# Patient Record
Sex: Female | Born: 1965 | Race: Black or African American | Hispanic: No | Marital: Married | Smoking: Never smoker
Health system: Southern US, Community
[De-identification: ages and names within clinical notes are randomized; demographics above are authoritative.]

## PROBLEM LIST (undated history)

## (undated) DIAGNOSIS — I10 Essential (primary) hypertension: Secondary | ICD-10-CM

## (undated) DIAGNOSIS — D509 Iron deficiency anemia, unspecified: Secondary | ICD-10-CM

## (undated) DIAGNOSIS — E785 Hyperlipidemia, unspecified: Secondary | ICD-10-CM

## (undated) HISTORY — DX: Hyperlipidemia, unspecified: E78.5

## (undated) HISTORY — DX: Essential (primary) hypertension: I10

## (undated) HISTORY — DX: Iron deficiency anemia, unspecified: D50.9

---

## 1997-06-20 ENCOUNTER — Inpatient Hospital Stay (HOSPITAL_COMMUNITY): Admission: AD | Admit: 1997-06-20 | Discharge: 1997-06-23 | Payer: Self-pay | Admitting: Obstetrics and Gynecology

## 1998-04-03 ENCOUNTER — Other Ambulatory Visit: Admission: RE | Admit: 1998-04-03 | Discharge: 1998-04-03 | Payer: Self-pay | Admitting: *Deleted

## 2000-03-11 ENCOUNTER — Other Ambulatory Visit: Admission: RE | Admit: 2000-03-11 | Discharge: 2000-03-11 | Payer: Self-pay | Admitting: Obstetrics and Gynecology

## 2001-11-23 ENCOUNTER — Other Ambulatory Visit: Admission: RE | Admit: 2001-11-23 | Discharge: 2001-11-23 | Payer: Self-pay | Admitting: Obstetrics and Gynecology

## 2002-01-04 ENCOUNTER — Ambulatory Visit (HOSPITAL_COMMUNITY): Admission: RE | Admit: 2002-01-04 | Discharge: 2002-01-04 | Payer: Self-pay | Admitting: Obstetrics and Gynecology

## 2002-01-04 ENCOUNTER — Encounter: Payer: Self-pay | Admitting: Obstetrics and Gynecology

## 2003-06-07 ENCOUNTER — Ambulatory Visit (HOSPITAL_COMMUNITY): Admission: RE | Admit: 2003-06-07 | Discharge: 2003-06-07 | Payer: Self-pay | Admitting: Family Medicine

## 2003-07-04 ENCOUNTER — Other Ambulatory Visit: Admission: RE | Admit: 2003-07-04 | Discharge: 2003-07-04 | Payer: Self-pay | Admitting: Family Medicine

## 2004-01-30 ENCOUNTER — Ambulatory Visit (HOSPITAL_COMMUNITY): Admission: RE | Admit: 2004-01-30 | Discharge: 2004-01-30 | Payer: Self-pay | Admitting: Obstetrics and Gynecology

## 2004-07-09 ENCOUNTER — Inpatient Hospital Stay (HOSPITAL_COMMUNITY): Admission: AD | Admit: 2004-07-09 | Discharge: 2004-07-12 | Payer: Self-pay | Admitting: Obstetrics and Gynecology

## 2004-08-20 ENCOUNTER — Other Ambulatory Visit: Admission: RE | Admit: 2004-08-20 | Discharge: 2004-08-20 | Payer: Self-pay | Admitting: Obstetrics and Gynecology

## 2005-07-16 ENCOUNTER — Encounter: Admission: RE | Admit: 2005-07-16 | Discharge: 2005-07-16 | Payer: Self-pay | Admitting: Obstetrics and Gynecology

## 2006-03-04 ENCOUNTER — Other Ambulatory Visit: Admission: RE | Admit: 2006-03-04 | Discharge: 2006-03-04 | Payer: Self-pay | Admitting: Obstetrics and Gynecology

## 2006-09-30 ENCOUNTER — Encounter: Admission: RE | Admit: 2006-09-30 | Discharge: 2006-09-30 | Payer: Self-pay | Admitting: Obstetrics and Gynecology

## 2007-02-09 ENCOUNTER — Ambulatory Visit: Payer: Self-pay | Admitting: Family Medicine

## 2007-02-09 DIAGNOSIS — D509 Iron deficiency anemia, unspecified: Secondary | ICD-10-CM

## 2007-02-10 LAB — CONVERTED CEMR LAB
Basophils Absolute: 0.1 10*3/uL (ref 0.0–0.1)
Basophils Relative: 0.8 % (ref 0.0–1.0)
Cholesterol: 187 mg/dL (ref 0–200)
Eosinophils Absolute: 0.1 10*3/uL (ref 0.0–0.6)
Eosinophils Relative: 0.7 % (ref 0.0–5.0)
Glucose, Bld: 86 mg/dL (ref 70–99)
HCT: 36.9 % (ref 36.0–46.0)
HDL: 48.2 mg/dL (ref >39.0)
Hemoglobin: 12.8 g/dL (ref 12.0–15.0)
LDL Cholesterol: 116 mg/dL — ABNORMAL HIGH (ref 0–99)
Lymphocytes Relative: 32.9 % (ref 12.0–46.0)
MCHC: 34.6 g/dL (ref 30.0–36.0)
MCV: 99.2 fL (ref 78.0–100.0)
Monocytes Absolute: 0.7 10*3/uL (ref 0.2–0.7)
Monocytes Relative: 7.5 % (ref 3.0–11.0)
Neutro Abs: 5.2 10*3/uL (ref 1.4–7.7)
Neutrophils Relative %: 58.1 % (ref 43.0–77.0)
Platelets: 223 10*3/uL (ref 150–400)
RBC: 3.72 M/uL — ABNORMAL LOW (ref 3.87–5.11)
RDW: 11.2 % — ABNORMAL LOW (ref 11.5–14.6)
TSH: 1.07 microintl units/mL (ref 0.35–5.50)
Total CHOL/HDL Ratio: 3.9
Triglycerides: 112 mg/dL (ref 0–149)
VLDL: 22 mg/dL (ref 0–40)
WBC: 9.1 10*3/uL (ref 4.5–10.5)

## 2007-02-11 ENCOUNTER — Telehealth (INDEPENDENT_AMBULATORY_CARE_PROVIDER_SITE_OTHER): Payer: Self-pay | Admitting: *Deleted

## 2007-11-23 ENCOUNTER — Encounter: Admission: RE | Admit: 2007-11-23 | Discharge: 2007-11-23 | Payer: Self-pay | Admitting: Obstetrics and Gynecology

## 2008-07-19 ENCOUNTER — Ambulatory Visit: Payer: Self-pay | Admitting: Family Medicine

## 2008-07-19 DIAGNOSIS — I10 Essential (primary) hypertension: Secondary | ICD-10-CM

## 2008-07-20 ENCOUNTER — Encounter (INDEPENDENT_AMBULATORY_CARE_PROVIDER_SITE_OTHER): Payer: Self-pay | Admitting: *Deleted

## 2008-07-20 LAB — CONVERTED CEMR LAB
BUN: 10 mg/dL (ref 6–23)
CO2: 29 meq/L (ref 19–32)
Calcium: 9.2 mg/dL (ref 8.4–10.5)
Chloride: 108 meq/L (ref 96–112)
Creatinine, Ser: 0.9 mg/dL (ref 0.4–1.2)
GFR calc non Af Amer: 87.12 mL/min (ref >60)
Glucose, Bld: 109 mg/dL — ABNORMAL HIGH (ref 70–99)
Potassium: 4.8 meq/L (ref 3.5–5.1)
Sodium: 144 meq/L (ref 135–145)

## 2008-08-23 ENCOUNTER — Ambulatory Visit: Payer: Self-pay | Admitting: Family Medicine

## 2008-08-29 ENCOUNTER — Encounter (INDEPENDENT_AMBULATORY_CARE_PROVIDER_SITE_OTHER): Payer: Self-pay | Admitting: *Deleted

## 2008-08-29 LAB — CONVERTED CEMR LAB
ALT: 21 units/L (ref 0–35)
AST: 23 units/L (ref 0–37)
Albumin: 3.8 g/dL (ref 3.5–5.2)
Alkaline Phosphatase: 69 units/L (ref 39–117)
BUN: 11 mg/dL (ref 6–23)
Basophils Absolute: 0 10*3/uL (ref 0.0–0.1)
Basophils Relative: 0.4 % (ref 0.0–3.0)
Bilirubin, Direct: 0.1 mg/dL (ref 0.0–0.3)
CO2: 31 meq/L (ref 19–32)
Calcium: 8.9 mg/dL (ref 8.4–10.5)
Chloride: 106 meq/L (ref 96–112)
Cholesterol: 223 mg/dL — ABNORMAL HIGH (ref 0–200)
Creatinine, Ser: 0.6 mg/dL (ref 0.4–1.2)
Direct LDL: 139.1 mg/dL
Eosinophils Absolute: 0.1 10*3/uL (ref 0.0–0.7)
Eosinophils Relative: 1.5 % (ref 0.0–5.0)
Folate: 7.3 ng/mL
GFR calc non Af Amer: 139.04 mL/min (ref >60)
Glucose, Bld: 93 mg/dL (ref 70–99)
HCT: 35.8 % — ABNORMAL LOW (ref 36.0–46.0)
HDL: 60.4 mg/dL (ref >39.00)
Hemoglobin: 12.5 g/dL (ref 12.0–15.0)
Lymphocytes Relative: 44 % (ref 12.0–46.0)
Lymphs Abs: 2.2 10*3/uL (ref 0.7–4.0)
MCHC: 34.8 g/dL (ref 30.0–36.0)
MCV: 100.8 fL — ABNORMAL HIGH (ref 78.0–100.0)
Monocytes Absolute: 0.5 10*3/uL (ref 0.1–1.0)
Monocytes Relative: 9.5 % (ref 3.0–12.0)
Neutro Abs: 2.3 10*3/uL (ref 1.4–7.7)
Neutrophils Relative %: 44.6 % (ref 43.0–77.0)
Platelets: 248 10*3/uL (ref 150.0–400.0)
Potassium: 4.2 meq/L (ref 3.5–5.1)
RBC: 3.55 M/uL — ABNORMAL LOW (ref 3.87–5.11)
RDW: 11.3 % — ABNORMAL LOW (ref 11.5–14.6)
Sodium: 140 meq/L (ref 135–145)
TSH: 1.07 microintl units/mL (ref 0.35–5.50)
Total Bilirubin: 0.9 mg/dL (ref 0.3–1.2)
Total CHOL/HDL Ratio: 4
Total Protein: 7.6 g/dL (ref 6.0–8.3)
Triglycerides: 63 mg/dL (ref 0.0–149.0)
VLDL: 12.6 mg/dL (ref 0.0–40.0)
Vitamin B-12: 473 pg/mL (ref 211–911)
WBC: 5.1 10*3/uL (ref 4.5–10.5)

## 2008-12-06 ENCOUNTER — Telehealth (INDEPENDENT_AMBULATORY_CARE_PROVIDER_SITE_OTHER): Payer: Self-pay | Admitting: *Deleted

## 2008-12-30 ENCOUNTER — Telehealth (INDEPENDENT_AMBULATORY_CARE_PROVIDER_SITE_OTHER): Payer: Self-pay | Admitting: *Deleted

## 2009-02-21 ENCOUNTER — Encounter: Admission: RE | Admit: 2009-02-21 | Discharge: 2009-02-21 | Payer: Self-pay | Admitting: Obstetrics and Gynecology

## 2009-05-30 LAB — CONVERTED CEMR LAB: Pap Smear: NORMAL

## 2009-08-14 ENCOUNTER — Telehealth (INDEPENDENT_AMBULATORY_CARE_PROVIDER_SITE_OTHER): Payer: Self-pay | Admitting: *Deleted

## 2009-08-28 ENCOUNTER — Ambulatory Visit: Payer: Self-pay | Admitting: Family Medicine

## 2009-08-28 LAB — CONVERTED CEMR LAB
ALT: 21 units/L (ref 0–35)
AST: 20 units/L (ref 0–37)
Albumin: 3.8 g/dL (ref 3.5–5.2)
Alkaline Phosphatase: 66 units/L (ref 39–117)
BUN: 11 mg/dL (ref 6–23)
Basophils Absolute: 0 10*3/uL (ref 0.0–0.1)
Basophils Relative: 0.4 % (ref 0.0–3.0)
Bilirubin, Direct: 0 mg/dL (ref 0.0–0.3)
CO2: 30 meq/L (ref 19–32)
Calcium: 8.7 mg/dL (ref 8.4–10.5)
Chloride: 104 meq/L (ref 96–112)
Cholesterol: 186 mg/dL (ref 0–200)
Creatinine, Ser: 0.8 mg/dL (ref 0.4–1.2)
Eosinophils Absolute: 0.1 10*3/uL (ref 0.0–0.7)
Eosinophils Relative: 1.5 % (ref 0.0–5.0)
GFR calc non Af Amer: 99.31 mL/min (ref >60)
Glucose, Bld: 97 mg/dL (ref 70–99)
HCT: 34.4 % — ABNORMAL LOW (ref 36.0–46.0)
HDL: 53.2 mg/dL (ref >39.00)
Hemoglobin: 11.9 g/dL — ABNORMAL LOW (ref 12.0–15.0)
LDL Cholesterol: 124 mg/dL — ABNORMAL HIGH (ref 0–99)
Lymphocytes Relative: 43.2 % (ref 12.0–46.0)
Lymphs Abs: 2.3 10*3/uL (ref 0.7–4.0)
MCHC: 34.5 g/dL (ref 30.0–36.0)
MCV: 100.4 fL — ABNORMAL HIGH (ref 78.0–100.0)
Monocytes Absolute: 0.4 10*3/uL (ref 0.1–1.0)
Monocytes Relative: 8.3 % (ref 3.0–12.0)
Neutro Abs: 2.4 10*3/uL (ref 1.4–7.7)
Neutrophils Relative %: 46.6 % (ref 43.0–77.0)
Platelets: 304 10*3/uL (ref 150.0–400.0)
Potassium: 3.4 meq/L — ABNORMAL LOW (ref 3.5–5.1)
RBC: 3.43 M/uL — ABNORMAL LOW (ref 3.87–5.11)
RDW: 12.8 % (ref 11.5–14.6)
Sodium: 140 meq/L (ref 135–145)
TSH: 0.86 microintl units/mL (ref 0.35–5.50)
Total Bilirubin: 0.6 mg/dL (ref 0.3–1.2)
Total CHOL/HDL Ratio: 3
Total Protein: 7.9 g/dL (ref 6.0–8.3)
Triglycerides: 43 mg/dL (ref 0.0–149.0)
VLDL: 8.6 mg/dL (ref 0.0–40.0)
WBC: 5.3 10*3/uL (ref 4.5–10.5)

## 2009-08-29 ENCOUNTER — Telehealth (INDEPENDENT_AMBULATORY_CARE_PROVIDER_SITE_OTHER): Payer: Self-pay | Admitting: *Deleted

## 2009-08-29 ENCOUNTER — Encounter: Payer: Self-pay | Admitting: Family Medicine

## 2009-08-29 LAB — CONVERTED CEMR LAB
Folate: 5.5 ng/mL
Vit D, 25-Hydroxy: 20 ng/mL — ABNORMAL LOW (ref 30–89)
Vitamin B-12: 662 pg/mL (ref 211–911)

## 2010-02-26 ENCOUNTER — Ambulatory Visit: Payer: Self-pay | Admitting: Family Medicine

## 2010-02-26 DIAGNOSIS — N390 Urinary tract infection, site not specified: Secondary | ICD-10-CM | POA: Insufficient documentation

## 2010-02-26 LAB — CONVERTED CEMR LAB
Bilirubin Urine: NEGATIVE
Glucose, Urine, Semiquant: NEGATIVE
Ketones, urine, test strip: NEGATIVE
Nitrite: NEGATIVE
Protein, U semiquant: NEGATIVE
Specific Gravity, Urine: 1.015
Urobilinogen, UA: 0.2
WBC Urine, dipstick: NEGATIVE
pH: 6

## 2010-05-28 ENCOUNTER — Other Ambulatory Visit: Payer: Self-pay | Admitting: Obstetrics and Gynecology

## 2010-05-28 DIAGNOSIS — Z1239 Encounter for other screening for malignant neoplasm of breast: Secondary | ICD-10-CM

## 2010-05-31 NOTE — Assessment & Plan Note (Signed)
Summary: UTI?//PH   Vital Signs:  Patient profile:   45 year old female Weight:      176 pounds Temp:     Jeanette.3 degrees F oral BP sitting:   122 / 70  (left arm)  Vitals Entered By: Doristine Devoid CMA (February 26, 2010 11:39 AM) CC: uti sx    History of Present Illness: Jeanette Kelley here today for ? UTI.  reports 'swelling down there', 'like when it's supposed to come on but it didn't come on b/c i have the mirena'.  holds urine, doesn't drink a lot of water.  has dysuria.  no hematuria.  + frequency, hesitancy, urgency.  Allergies (verified): No Known Drug Allergies  Review of Systems      See HPI  Physical Exam  General:  well-nourished, well-hydrated, and overweight-appearing.   Abdomen:  Bowel sounds positive,abdomen soft and non-tender without masses, organomegaly or hernias noted.  no suprapubic or CVA tenderness Genitalia:  normal introitus, no external lesions, no vaginal discharge, and mucosa pink and moist.  IUD strings visible     Impression & Recommendations:  Problem # 1:  UTI (ICD-599.0) Assessment New given pt's abnormal UA will tx as UTI.  start keflex.  pt reports swollen vagina but no abnormality on PE.  encouraged her to see GYN if sxs persist.  Pt expresses understanding and is in agreement w/ this plan. Her updated medication list for this problem includes:    Cephalexin 500 Mg Tabs (Cephalexin) .Marland Kitchen... Take one by mouth 2 times daily  Orders: Specimen Handling (16109) T-Culture, Urine ([id number])  Complete Medication List: 1)  Hydrochlorothiazide 12.5 Mg Tabs (Hydrochlorothiazide) .... Take 1 tab  by mouth every morning 2)  Cephalexin 500 Mg Tabs (Cephalexin) .... Take one by mouth 2 times daily  Patient Instructions: 1)  You have a bladder infection and this is likely the cause of your pelvic symptoms 2)  Take the Cephalexin as directed for your bladder infection 3)  Your IUD strings are visible and appear to be in the right place 4)  If you  continue to have problems you should contact your GYN 5)  Hang in there! Prescriptions: CEPHALEXIN 500 MG  TABS (CEPHALEXIN) take one by mouth 2 times daily  #10 x 0   Entered and Authorized by:   Neena Rhymes MD   Signed by:   Neena Rhymes MD on 02/26/2010   Method used:   Electronically to        [facility] [address] #[id number]* (retail)       [address] [place], [place]  [address]       Ph: [phone]       Fax: [phone]   RxID:   [id number]    Orders Added: 1)  Specimen Handling [99000] 2)  T-Culture, Urine [[id number]] 3)  Est. Patient Level III [87564]    Laboratory Results   Urine Tests    Routine Urinalysis   Glucose: negative   (Normal Range: Negative) Bilirubin: negative   (Normal Range: Negative) Ketone: negative   (Normal Range: Negative) Spec. Gravity: 1.015   (Normal Range: 1.003-1.035) Blood: small   (Normal Range: Negative) pH: 6.0   (Normal Range: 5.0-8.0) Protein: negative   (Normal Range: Negative) Urobilinogen: 0.2   (Normal Range: 0-1) Nitrite: negative   (Normal Range: Negative) Leukocyte Esterace: negative   (Normal Range: Negative)

## 2010-05-31 NOTE — Progress Notes (Signed)
Summary: labs  Phone Note Outgoing Call   Call placed by: Doristine Devoid,  Aug 29, 2009 1:33 PM Call placed to: Patient Summary of Call: pt's level is low.  needs 50,000 units weekly x 8 weeks and then recheck level   Follow-up for Phone Call        called patient unable to reach or leave msg will try later.......Marland KitchenDoristine Devoid  Aug 29, 2009 1:36 PM  no answer, unable to leave message.........Marland KitchenShary Decamp  Sep 02, 2009 10:08 AM  unable to reach patient or leave msg will mail letter to have patient contact office .......Marland KitchenDoristine Devoid  Sep 07, 2009 4:25 PM

## 2010-05-31 NOTE — Assessment & Plan Note (Signed)
Summary: cpx//pt will be fasting//lch   Vital Signs:  Patient profile:   45 year old female Height:      60.75 inches Weight:      169 pounds BMI:     32.31 Pulse rate:   70 / minute BP sitting:   122 / 80  (left arm)  Vitals Entered By: Doristine Devoid (Aug 28, 2009 8:43 AM) CC: CPX AND LABS   History of Present Illness: 45 yo woman here today for CPE.  pap done 4/11 by GYN- [facility]er.  now has Mirena.  no concerns about her health.  UTD on Mammogram.  Preventive Screening-Counseling & Management  Alcohol-Tobacco     Alcohol drinks/day: 0     Smoking Status: never  Caffeine-Diet-Exercise     Does Patient Exercise: no      Sexual History:  currently monogamous.        Drug Use:  never.    Problems Prior to Update: 1)  Neoplasm, Malignant, Colon, Family Hx  (ICD-V16.0) 2)  Hypertension, Benign Essential  (ICD-401.1) 3)  Preventive Health Care  (ICD-V70.0) 4)  Anemia-iron Deficiency  (ICD-280.9) 5)  Family History Breast Cancer 1st Degree Relative <50  (ICD-V16.3) 6)  Family History Diabetes 1st Degree Relative  (ICD-V18.0)  Current Medications (verified): 1)  Hydrochlorothiazide 12.5 Mg  Tabs (Hydrochlorothiazide) .... Take 1 Tab  By Mouth Every Morning  Allergies (verified): No Known Drug Allergies  Past History:  Past Surgical History: Last updated: 02/09/2007 c/s  Family History: Last updated: 08/23/2008 Family History Diabetes 1st degree relative Family History Hypertension Family History Breast cancer 1st degree relative <50 maternal uncle had colon cancer in late 50s.  Social History: Last updated: 07/19/2008 Occupation: Hair stylist Married, 2 sons- 1999, 2006 Never Smoked Alcohol use-no Drug use-no Regular exercise-yes  Past Medical History: Anemia-iron deficiency Mirena- 4/11  Review of Systems  The patient denies anorexia, fever, weight loss, weight gain, vision loss, decreased hearing, hoarseness, chest pain, syncope, dyspnea on  exertion, peripheral edema, prolonged cough, headaches, abdominal pain, melena, hematochezia, severe indigestion/heartburn, hematuria, suspicious skin lesions, depression, abnormal bleeding, enlarged lymph nodes, and breast masses.    Physical Exam  General:  well-nourished, well-hydrated, and overweight-appearing.   Head:  Normocephalic and atraumatic without obvious abnormalities. No apparent alopecia or balding. Eyes:  No corneal or conjunctival inflammation noted. EOMI. Perrla. Funduscopic exam benign, without hemorrhages, exudates or papilledema. Vision grossly normal. Ears:  External ear exam shows no significant lesions or deformities.  Otoscopic examination reveals clear canals, tympanic membranes are intact bilaterally without bulging, retraction, inflammation or discharge. Hearing is grossly normal bilaterally. Nose:  External nasal examination shows no deformity or inflammation. Nasal mucosa are pink and moist without lesions or exudates. Mouth:  Oral mucosa and oropharynx without lesions or exudates.  Teeth in good repair. Neck:  No deformities, masses, or tenderness noted. Breasts:  deferred to GYN Lungs:  Normal respiratory effort, chest expands symmetrically. Lungs are clear to auscultation, no crackles or wheezes. Heart:  Normal rate and regular rhythm. S1 and S2 normal without gallop, murmur, click, rub or other extra sounds. Abdomen:  Bowel sounds positive,abdomen soft and non-tender without masses, organomegaly or hernias noted. Genitalia:  deferred to gyn Msk:  No deformity or scoliosis noted of thoracic or lumbar spine.   Pulses:  +2 carotid, radial, DP Extremities:  No clubbing, cyanosis, edema, or deformity noted with normal full range of motion of all joints.   Neurologic:  No cranial nerve deficits noted. Station  and gait are normal. Plantar reflexes are down-going bilaterally. DTRs are symmetrical throughout. Sensory, motor and coordinative functions appear intact. Skin:   Intact without suspicious lesions or rashes Cervical Nodes:  No lymphadenopathy noted Psych:  Cognition and judgment appear intact. Alert and cooperative with normal attention span and concentration. No apparent delusions, illusions, hallucinations   Impression & Recommendations:  Problem # 1:  PREVENTIVE HEALTH CARE (ICD-V70.0) Assessment Unchanged Pt's PE WNL.  UTD on pap, mammogram.  no-showed for colonoscopy.  stressed importance of colonoscopy given family hx.  pt says 'i'll wait another year or so'.  check labs.  anticipatory guidance provided. Orders: Venipuncture (14782) TLB-Lipid Panel (80061-LIPID) TLB-BMP (Basic Metabolic Panel-BMET) (80048-METABOL) TLB-CBC Platelet - w/Differential (85025-CBCD) TLB-Hepatic/Liver Function Pnl (80076-HEPATIC) TLB-TSH (Thyroid Stimulating Hormone) (84443-TSH) T-Vitamin D (25-Hydroxy) (95621-30865)  Problem # 2:  HYPERTENSION, BENIGN ESSENTIAL (ICD-401.1) Assessment: Unchanged well controlled today.  asymptomatic. Her updated medication list for this problem includes:    Hydrochlorothiazide 12.5 Mg Tabs (Hydrochlorothiazide) .Marland Kitchen... Take 1 tab  by mouth every morning  Complete Medication List: 1)  Hydrochlorothiazide 12.5 Mg Tabs (Hydrochlorothiazide) .... Take 1 tab  by mouth every morning  Patient Instructions: 1)  Please schedule a follow-up appointment in 6 months to recheck blood pressure. 2)  Try and make healthy diet choices and get regular exercise 3)  We'll notify you of your lab results 4)  Call with any questions or concerns 5)  Please think about getting your colonoscopy 6)  Have a great summer!  Prescriptions: HYDROCHLOROTHIAZIDE 12.5 MG  TABS (HYDROCHLOROTHIAZIDE) Take 1 tab  by mouth every morning  #30 x 6   Entered by:   Doristine Devoid   Authorized by:   Neena Rhymes MD   Signed by:   Doristine Devoid on 08/28/2009   Method used:   Electronically to        [facility] [address] #[id number]* (retail)       [address]  [place], [place]  [address]       Ph: [phone]       Fax: [phone]   RxID:   [id number]    Preventive Care Screening  Pap Smear:    Date:  05/30/2009    Results:  normal

## 2010-05-31 NOTE — Progress Notes (Signed)
Summary: refill  Phone Note Refill Request Message from:  Fax from Pharmacy on August 14, 2009 8:51 AM  Refills Requested: Medication #1:  HYDROCHLOROTHIAZIDE 12.5 MG  TABS Take 1 tab  by mouth every morning. [facility] [address] fax [phone]   Method Requested: Fax to Local Pharmacy Next Appointment Scheduled: 08/28/2009 Initial call taken by: Barb Merino,  August 14, 2009 8:52 AM    Prescriptions: HYDROCHLOROTHIAZIDE 12.5 MG  TABS (HYDROCHLOROTHIAZIDE) Take 1 tab  by mouth every morning  #30 x 0   Entered by:   Kandice Hams   Authorized by:   Neena Rhymes MD   Signed by:   Kandice Hams on 08/14/2009   Method used:   Faxed to ...       [facility] #[id number]* (retail)       [address] [place], [place]  [address]       Ph: [phone]       Fax: [phone]   RxID:   [id number]

## 2010-06-05 ENCOUNTER — Ambulatory Visit
Admission: RE | Admit: 2010-06-05 | Discharge: 2010-06-05 | Disposition: A | Discharge status: Home / Self Care | Payer: Self-pay | Source: Ambulatory Visit | Attending: Obstetrics and Gynecology | Admitting: Obstetrics and Gynecology

## 2010-06-05 DIAGNOSIS — Z1239 Encounter for other screening for malignant neoplasm of breast: Secondary | ICD-10-CM

## 2010-08-17 ENCOUNTER — Other Ambulatory Visit: Payer: Self-pay | Admitting: *Deleted

## 2010-08-17 MED ORDER — HYDROCHLOROTHIAZIDE 12.5 MG PO CAPS: 12.5000 mg | ORAL_CAPSULE | Freq: Every day | ORAL | Status: DC

## 2010-08-17 NOTE — Telephone Encounter (Signed)
Due for cpx next month, sent refill.

## 2010-09-03 ENCOUNTER — Encounter: Payer: Self-pay | Admitting: Family Medicine

## 2010-09-14 NOTE — Op Note (Signed)
NAMESAMYRAH, BRUSTER                 ACCOUNT NO.:  [id number]   MEDICAL RECORD NO.:  [id number]          PATIENT TYPE:  INP   LOCATION:  9101                          FACILITY:  [facility]   PHYSICIAN:  Hal Morales, M.D.DATE OF BIRTH:  Nov 26, 1965   DATE OF PROCEDURE:  07/09/2004  DATE OF DISCHARGE:                                 OPERATIVE REPORT   PREOPERATIVE DIAGNOSES:  1.  Intrauterine pregnancy and 41-42 weeks' gestation.  2.  Meconium-stained amniotic fluid.  3.  Nonreassuring fetal heart rate tracing.  4.  Failure to descend.   POSTOPERATIVE DIAGNOSES:  1.  Intrauterine pregnancy and 41-42 weeks' gestation.  2.  Meconium-stained amniotic fluid.  3.  Nonreassuring fetal heart rate tracing.  4.  Failure to descend.  5.  Nuchal cord.   OPERATION:  Primary low transverse cesarean section.   SURGEON:  Hal Morales, M.D.   FIRST ASSISTANT:  Wynelle Bourgeois, certified nurse midwife.   ANESTHESIA:  Epidural.   ESTIMATED BLOOD LOSS:  Less than 750 cc.   COMPLICATIONS:  None.   FINDINGS:  The patient was delivered of a female infant whose name is Onalee Hua,  weighing 8 pounds 8 ounces, with Apgars of 9 and 9 at one and five minutes,  respectively.  The uterus, tubes and ovaries were normal for the gravid  state.   PROCEDURE:  A discussion was held with the patient and father of the baby  concerning the fetal heart rate tracing, which since the time the patient  became fully dilated had begun to deteriorate.  Initially the fetal heart  rate during every fifth to sixth contraction had a significant deceleration  to a nadir of around 60 beats per minute, then lasting for approximately two  minutes.  This occurred whether the patient was pushing with the contraction  or whether she was not pushing with the contraction.  She pushed for  approximately one hour with this pattern, then was noted to have the vertex  at 0 to +1 station.  At that time the fetal heart rate decelerations  became  repetitive with occasional overshoot with recovery.  At this point I  discussed with the patient the fact that after an hour of pushing, she had  been able to realize minimal descent of fetal vertex and felt that an  additional hour of pushing might still not place the fetal vertex into the  range where an operative vaginal delivery would be an option.  In addition,  over the preceding hour the fetal heart rate had deteriorated and it was not  clear to me that the fetus would tolerate an additional hour pushing.  At  that time I recommended that the patient proceed with cesarean section,  explaining the risks of anesthesia, bleeding, infection and damage to  adjacent organs.  The patient and father of the baby agreed to cesarean  section and wished to proceed.   PROCEDURE:  The patient was taken to the operating room, after that  appropriate identification and placed on the operating table.  Her Foley  catheter and labor epidural  were in place.  The abdomen was prepped with  multiple layers of Betadine and draped as a sterile field.  After assurance  of adequate anesthesia, subcutaneous infiltration of 0.25% Marcaine in the  suprapubic region was undertaken.  A total of 20 mL was used.  A suprapubic  incision was made and the abdomen opened in layers.  The peritoneum was entered and the bladder blade placed.  The uterus was  incised approximately 2 cm above the uterovesical fold and the infant  delivered from the occiput posterior position.  The infant was noted to have  a nuchal cord, and this was reduced.  The infant's head was delivered and  suctioned with a DeLee trap.  The remainder of the infant was then  delivered, the cord clamped and cut, and the infant handed off to the  awaiting pediatricians.  The appropriate cord blood was drawn and placenta  noted to have separated from the uterus and was removed from the operative  field.  The uterine incision was closed with  running interlocking suture of  0 Vicryl.  An imbricating suture of 0 Vicryl was then placed.  Copious irrigation carried out and hemostasis was noted to be adequate.  The  abdominal peritoneum was closed with running suture of 2-0 Vicryl.  The  rectus muscles were reapproximated in midline with figure-of-eight suture of  2-0 Vicryl.  The rectus fascia was closed with running suture of 0 Vicryl,  then reinforced on either side of midline with figure-of-eight sutures of 0  Vicryl.  The subcutaneous tissue was irrigated and made hemostatic with  Bovie cautery.  Skin staples were applied to the skin incision.  A sterile dressing was applied and the patient was taken from the operating  room to the recovery room in satisfactory condition, having tolerated the  procedure well with sponge and instrument counts correct.  The infant went  to the full-term nursery.      VPH/MEDQ  D:  07/09/2004  T:  07/09/2004  Job:  [id number]

## 2010-09-14 NOTE — H&P (Signed)
[name], [name]                 ACCOUNT NO.:  [id number]   MEDICAL RECORD NO.:  [id number]          PATIENT TYPE:  INP   LOCATION:  9170                          FACILITY:  [facility]   PHYSICIAN:  Crist Fat. Rivard, M.D. DATE OF BIRTH:  12/19/1965   DATE OF ADMISSION:  07/09/2004  DATE OF DISCHARGE:                                HISTORY & PHYSICAL   Jeanette Kelley is a 45 year old gravida 2, para 1-0-0-1 at 40-6/7 weeks who  presented with uterine contractions every five minutes at 3 a.m.  She denies  any leaking or bleeding and reports positive fetal movement.  Pregnancy has  been remarkable for:  1.  Advanced maternal age with amnio declined, quad screen declined.  2.  Questionable last menstrual period with dating by first trimester      ultrasound.   The patient is very uncomfortable with her contractions.  She denies any  headache, visual symptoms or epigastric pain.   PRENATAL LABORATORIES:  Blood type is O+, RH antibody negative, VDRL  nonreactive, Rubella titer positive, hepatitis B surface antigen negative.  HIV is nonreactive.  Cystic fibrosis testing is negative.  Pap was normal.  Hemoglobin upon entry into the practice was 11.6.  It was 11.1 at 26 weeks.  AFP was declined.  Glucola was normal at 116.  Group B strep culture was  negative at 36 weeks.  GC and Chlamydia cultures were also negative at the  first visit as well as at 36 weeks.  EDC of July 03, 2004 was established  by ultrasound at 8 weeks secondary to questionable last menstrual period.   HISTORY OF PRESENT PREGNANCY:  The patient entered care at approximately 10  weeks.  She originally declined amnio and quadruple screen.  She had an  ultrasound at 18 weeks that showed normal growth and development.  She had  an inflamed area on her vulva at 20 weeks.  This was evaluated at the  office.  There was a nontender pustule with pointing.  HSV cultures were  negative.  Titers were negative in July.  The patient has  declined repeat.  This lesion did resolve over the next two weeks.  She had a normal Glucola.  The rest of her pregnancy was essentially uncomplicated.   OBSTETRICAL HISTORY:  In 1999, she had a vaginal birth of a female infant,  weight 7 pounds 8 ounces at 40-2/7 weeks.  She was in labor 18 hours.  She  had epidural anesthesia.  She had no complications.  She was on an iron  supplement after delivery.   MEDICAL HISTORY:  She is a previous oral contraceptive user for two years  but stopped in 1992.  In 1998, she had a visit with an infertility physician  x1.  She has occasional yeast infections.  She reports the usual childhood  illnesses.   SURGICAL HISTORY:  Includes at age 52, an appendectomy.  Her only other  hospitalization was for childbirth.   FAMILY HISTORY:  Mother is an adult-onset insulin-dependent diabetic.  Maternal uncle is also an insulin-dependent diabetic.  The patient's mother  had breast cancer at age 7 and is now deceased.  Her maternal uncle had  colon cancer in the 45's and is now deceased.  Maternal uncle was recently  diagnosed with pancreatic cancer at age 61.   GENETIC HISTORY:  Remarkable for the patient's first cousin's child born  with a heart in the right side of the chest.  Genetic history also  remarkable for the patient's advanced maternal age.   SOCIAL HISTORY:  The patient is married to the father of the baby.  He is  involved and supportive.  His name is Jeanette Kelley.  The patient is high  school educated.  She is a hair stylist.  Her husband is college educated.  He is a Doctor, general practice.  She has been followed [facility] physician service at  [facility].  She denies any alcohol, drug or tobacco use during  this pregnancy.   PHYSICAL EXAMINATION:  VITAL SIGNS:  Blood pressure is 154/96, repeat blood  pressure was approximately 140/90.  Other vital signs are stable.  HEENT:  Within normal limits.  LUNGS:  Bilateral breath sounds are clear.   HEART:  A regular rate and rhythm without murmur.  BREASTS:  Soft and nontender.  ABDOMEN:  Fundal height is approximately 39 cm.  Estimated fetal weight is 7-  8 pounds.  Uterine contractions are approximately every five minutes,  moderate quality.  PELVIC:  Cervical exam 5 cm, 100% vertex and -1 to -2 station with an intact  bag of water.  Fetal monitor shows mild variable with uterine contractions,  some with bilateral shoulders, some with posterior shoulders only.  There  was a positive response to scalp stimulation and an occasional acceleration.  No late decelerations are noted.  EXTREMITIES:  Deep tendon reflexes are 1 to 2+ without clonus.  There is a  trace edema noted.   IMPRESSION:  1.  Intra-uterine pregnancy at 40-6/7 weeks.  2.  Mild elevation of blood pressure but no previous history.  3.  Active labor.  4.  Negative group B strep.   PLAN:  1.  Admit to the birthing suite for consult with Dr. Estanislado Pandy as the attending      physician.  2.  Check [facility] laboratories and a clean catch urine.  3.  Epidural as needed.  4.  Close observation of fetal heart rate status.  5.  M.D. will follow.      VLL/MEDQ  D:  07/09/2004  T:  07/09/2004  Job:  [id number]

## 2010-09-14 NOTE — Discharge Summary (Signed)
Jeanette Kelley, Jeanette Kelley                 ACCOUNT NO.:  [id number]   MEDICAL RECORD NO.:  [id number]          PATIENT TYPE:  INP   LOCATION:  9146                          [facility]ILITY:  [facility]   PHYSICIAN:  Crist Fat. Rivard, M.D. DATE OF BIRTH:  [birth year]   DATE OF ADMISSION:  07/09/2004  DATE OF DISCHARGE:                                 DISCHARGE SUMMARY   ADMISSION DIAGNOSES:  1.  Intrauterine pregnancy at 40 and six-sevenths weeks.  2.  Mild elevation of blood pressure.  3.  Active labor.  4.  Negative group B streptococcus.   DISCHARGE DIAGNOSES:  1.  Intrauterine pregnancy at 40 and six-sevenths weeks.  2.  Mild elevation of blood pressure.  3.  Active labor.  4.  Negative group B streptococcus.  5.  Nonreassuring fetal heart rate tracing.  6.  Meconium-stained fluid.  7.  Postpartum leukocytosis (resolved).   HOSPITAL PROCEDURES:  1.  Electronic fetal monitoring.  2.  Primary low transverse cesarean section.  3.  Epidural anesthesia.   HOSPITAL COURSE:  The patient was admitted in active labor at 5 cm dilated.  She progressed for several hours and developed light meconium-stained fluid  upon amniotomy. She developed fetal heart rate decelerations to 60 beats per  minute with contractions which persisted. Decision was made at that time to  proceed with an elective cesarean section which was performed under epidural  anesthesia for a viable female infant named Onalee Hua weighing 8 pounds 8 ounces,  Apgars 9 and 9. Estimated blood loss was 750 mL. There were no  complications. Postoperative day #1, the patient was doing well, voiding,  tolerating food. Her white count was 22.2. The white count was repeated the  next day and it was down to 17, and on postoperative day #3 it was down to  10.5. She recovered normally through postoperative day #2 and day #3, and on  day #3 she was ready to go home. She was undecided regarding contraception.  Vital signs were stable. White count was 10.5,  hemoglobin was 9.6. Abdomen  was soft and appropriately tender. Incision was clean, dry, and intact.  Lochia was small. Extremities were normal except for trace edema. She was  deemed to have received the full benefit of her hospital stay and was  discharged home.   DISCHARGE MEDICATIONS:  1.  Motrin 600 mg p.o. q.6h. p.r.n.  2.  Tylox one to two p.o. q.4h. p.r.n.   DISCHARGE LABORATORY DATA:  White blood cell count 10.6, hemoglobin 9.6,  platelets 210.   DISCHARGE INSTRUCTIONS:  Per [facility] handout.   DISCHARGE FOLLOW-UP:  In 6 weeks or p.r.n.      MLW/MEDQ  D:  07/12/2004  T:  07/12/2004  Job:  [id number]

## 2010-11-16 ENCOUNTER — Other Ambulatory Visit: Payer: Self-pay | Admitting: Family Medicine

## 2010-11-16 MED ORDER — HYDROCHLOROTHIAZIDE 12.5 MG PO CAPS: 12.5000 mg | ORAL_CAPSULE | Freq: Every day | ORAL | Status: DC

## 2010-11-16 NOTE — Telephone Encounter (Signed)
RX sent with notation of no further refills until ov. CPX over due.

## 2011-01-01 ENCOUNTER — Encounter: Payer: Self-pay | Admitting: Family Medicine

## 2011-01-01 ENCOUNTER — Other Ambulatory Visit (HOSPITAL_COMMUNITY)
Admission: RE | Admit: 2011-01-01 | Discharge: 2011-01-01 | Disposition: A | Discharge status: Home / Self Care | Payer: BC Managed Care – PPO | Source: Ambulatory Visit | Attending: Family Medicine | Admitting: Family Medicine

## 2011-01-01 ENCOUNTER — Ambulatory Visit (INDEPENDENT_AMBULATORY_CARE_PROVIDER_SITE_OTHER): Payer: BC Managed Care – PPO | Admitting: Family Medicine

## 2011-01-01 ENCOUNTER — Telehealth: Payer: Self-pay

## 2011-01-01 DIAGNOSIS — Z124 Encounter for screening for malignant neoplasm of cervix: Secondary | ICD-10-CM

## 2011-01-01 DIAGNOSIS — Z01419 Encounter for gynecological examination (general) (routine) without abnormal findings: Secondary | ICD-10-CM

## 2011-01-01 LAB — CBC WITH DIFFERENTIAL/PLATELET
Basophils Absolute: 0 10*3/uL (ref 0.0–0.1)
Basophils Relative: 0.5 % (ref 0.0–3.0)
Eosinophils Absolute: 0.1 10*3/uL (ref 0.0–0.7)
Eosinophils Relative: 0.8 % (ref 0.0–5.0)
HCT: 37.4 % (ref 36.0–46.0)
Hemoglobin: 12.4 g/dL (ref 12.0–15.0)
Lymphocytes Relative: 43 % (ref 12.0–46.0)
Lymphs Abs: 2.9 10*3/uL (ref 0.7–4.0)
MCHC: 33 g/dL (ref 30.0–36.0)
MCV: 101.4 fl — ABNORMAL HIGH (ref 78.0–100.0)
Monocytes Absolute: 0.5 10*3/uL (ref 0.1–1.0)
Monocytes Relative: 7.1 % (ref 3.0–12.0)
Neutro Abs: 3.3 10*3/uL (ref 1.4–7.7)
Neutrophils Relative %: 48.6 % (ref 43.0–77.0)
Platelets: 281 10*3/uL (ref 150.0–400.0)
RBC: 3.69 Mil/uL — ABNORMAL LOW (ref 3.87–5.11)
RDW: 12.7 % (ref 11.5–14.6)
WBC: 6.7 10*3/uL (ref 4.5–10.5)

## 2011-01-01 LAB — HEPATIC FUNCTION PANEL
ALT: 20 U/L (ref 0–35)
AST: 24 U/L (ref 0–37)
Albumin: 4.1 g/dL (ref 3.5–5.2)
Alkaline Phosphatase: 77 U/L (ref 39–117)
Bilirubin, Direct: 0.1 mg/dL (ref 0.0–0.3)
Total Bilirubin: 0.8 mg/dL (ref 0.3–1.2)
Total Protein: 7.9 g/dL (ref 6.0–8.3)

## 2011-01-01 LAB — LIPID PANEL
Cholesterol: 199 mg/dL (ref 0–200)
LDL Cholesterol: 133 mg/dL — ABNORMAL HIGH (ref 0–99)
Triglycerides: 40 mg/dL (ref 0.0–149.0)
VLDL: 8 mg/dL (ref 0.0–40.0)

## 2011-01-01 LAB — BASIC METABOLIC PANEL
BUN: 13 mg/dL (ref 6–23)
CO2: 28 mEq/L (ref 19–32)
Calcium: 8.7 mg/dL (ref 8.4–10.5)
Chloride: 103 mEq/L (ref 96–112)
Creatinine, Ser: 0.8 mg/dL (ref 0.4–1.2)
GFR: 106.37 mL/min (ref >60.00)
Glucose, Bld: 92 mg/dL (ref 70–99)
Potassium: 3.7 mEq/L (ref 3.5–5.1)
Sodium: 139 mEq/L (ref 135–145)

## 2011-01-01 LAB — TSH: TSH: 1.15 u[IU]/mL (ref 0.35–5.50)

## 2011-01-01 MED ORDER — HYDROCHLOROTHIAZIDE 12.5 MG PO CAPS: 12.5000 mg | ORAL_CAPSULE | Freq: Every day | ORAL | Status: DC

## 2011-01-01 NOTE — Progress Notes (Signed)
  Subjective:    Patient ID: Jeanette Kelley, female    DOB: October 04, 1965, 45 y.o.   MRN: [id number]  HPI CPE- no concerns today.  UTD on mammogram.  Due for pap.   Review of Systems Patient reports no vision/ hearing changes, adenopathy,fever, weight change,  persistant/recurrent hoarseness , swallowing issues, chest pain, palpitations, edema, persistant/recurrent cough, hemoptysis, dyspnea (rest/exertional/paroxysmal nocturnal), gastrointestinal bleeding (melena, rectal bleeding), abdominal pain, significant heartburn, bowel changes, GU symptoms (dysuria, hematuria, incontinence), Gyn symptoms (abnormal  bleeding, pain),  syncope, focal weakness, memory loss, numbness & tingling, skin/hair/nail changes, abnormal bruising or bleeding, anxiety, or depression.     Objective:   Physical Exam  General Appearance:    Alert, cooperative, no distress, appears stated age  Head:    Normocephalic, without obvious abnormality, atraumatic  Eyes:    PERRL, conjunctiva/corneas clear, EOM's intact, fundi    benign, both eyes  Ears:    Normal TM's and external ear canals, both ears  Nose:   Nares normal, septum midline, mucosa normal, no drainage    or sinus tenderness  Throat:   Lips, mucosa, and tongue normal; teeth and gums normal  Neck:   Supple, symmetrical, trachea midline, no adenopathy;    Thyroid: no enlargement/tenderness/nodules  Back:     Symmetric, no curvature, ROM normal, no CVA tenderness  Lungs:     Clear to auscultation bilaterally, respirations unlabored  Chest Wall:    No tenderness or deformity   Heart:    Regular rate and rhythm, S1 and S2 normal, no murmur, rub   or gallop  Breast Exam:    No tenderness, masses, or nipple abnormality  Abdomen:     Soft, non-tender, bowel sounds active all four quadrants,    no masses, no organomegaly  Genitalia:    External genitalia normal, cervix normal in appearance, IUD strings visible, no CMT, uterus in normal size and position, adnexa w/out mass  or tenderness, mucosa pink and moist, no lesions or discharge present  Rectal:    Normal external appearance  Extremities:   Extremities normal, atraumatic, no cyanosis or edema  Pulses:   2+ and symmetric all extremities  Skin:   Skin color, texture, turgor normal, no rashes or lesions  Lymph nodes:   Cervical, supraclavicular, and axillary nodes normal  Neurologic:   CNII-XII intact, normal strength, sensation and reflexes    throughout          Assessment & Plan:

## 2011-01-01 NOTE — Assessment & Plan Note (Signed)
Pap collected. 

## 2011-01-01 NOTE — Telephone Encounter (Signed)
Left message for pt to call back  °

## 2011-01-01 NOTE — Telephone Encounter (Signed)
Message copied by Beverely Low on Tue Jan 01, 2011  5:10 PM ------      Message from: Sheliah Hatch      Created: Tue Jan 01, 2011  1:32 PM       Labs look good!  HDL (good cholesterol) is better than last time- this is because of your regular exercise!  Keep up the good work!            LDL (bad cholesterol) is mildly elevated and will continue to improve w/ healthy diet and regular exercise.  No need for cholesterol meds at this time.  Will continue to monitor this

## 2011-01-01 NOTE — Patient Instructions (Signed)
Follow up in 1 year or as needed We'll notify you of your lab results Keep up the good work on diet and exercise- you look great! Call with any questions or concerns Have a great holiday season!!!

## 2011-01-01 NOTE — Assessment & Plan Note (Signed)
Pt's PE WNL.  UTD on health maintenance.  Check labs.  Anticipatory guidance provided.  

## 2011-01-02 ENCOUNTER — Telehealth: Payer: Self-pay

## 2011-01-02 NOTE — Telephone Encounter (Signed)
Message copied by Beverely Low on Wed Jan 02, 2011 11:12 AM ------      Message from: Sheliah Hatch      Created: Tue Jan 01, 2011  1:32 PM       Labs look good!  HDL (good cholesterol) is better than last time- this is because of your regular exercise!  Keep up the good work!            LDL (bad cholesterol) is mildly elevated and will continue to improve w/ healthy diet and regular exercise.  No need for cholesterol meds at this time.  Will continue to monitor this

## 2011-01-02 NOTE — Telephone Encounter (Signed)
Pt.notified

## 2011-01-03 ENCOUNTER — Telehealth: Payer: Self-pay

## 2011-01-03 LAB — VITAMIN D 1,25 DIHYDROXY
Vitamin D 1, 25 (OH)2 Total: 89 pg/mL — ABNORMAL HIGH (ref 18–72)
Vitamin D2 1, 25 (OH)2: <8 pg/mL
Vitamin D3 1, 25 (OH)2: 89 pg/mL

## 2011-01-03 NOTE — Telephone Encounter (Signed)
Message copied by Beverely Low on Thu Jan 03, 2011  4:03 PM ------      Message from: Sheliah Hatch      Created: Thu Jan 03, 2011  7:53 AM       Vit D level is high.  If taking supplements, please stop.  Otherwise this is nothing to worry about

## 2011-01-03 NOTE — Telephone Encounter (Signed)
Results mailed 

## 2011-01-03 NOTE — Telephone Encounter (Signed)
Pt aware.

## 2011-07-26 ENCOUNTER — Ambulatory Visit: Payer: Self-pay | Admitting: Internal Medicine

## 2011-07-26 VITALS — BP 132/84 | HR 86 | Temp 98.5°F | Resp 16 | Ht 60.75 in | Wt 186.4 lb

## 2011-07-26 DIAGNOSIS — R109 Unspecified abdominal pain: Secondary | ICD-10-CM

## 2011-07-26 DIAGNOSIS — N390 Urinary tract infection, site not specified: Secondary | ICD-10-CM

## 2011-07-26 DIAGNOSIS — R350 Frequency of micturition: Secondary | ICD-10-CM

## 2011-07-26 LAB — POCT UA - MICROSCOPIC ONLY
Casts, Ur, LPF, POC: NEGATIVE
Crystals, Ur, HPF, POC: NEGATIVE
Mucus, UA: NEGATIVE
Yeast, UA: NEGATIVE

## 2011-07-26 MED ORDER — CIPROFLOXACIN HCL 250 MG PO TABS: 250.0000 mg | ORAL_TABLET | Freq: Two times a day (BID) | ORAL | Status: AC

## 2011-07-26 NOTE — Progress Notes (Signed)
  Subjective:    Patient ID: Jeanette Kelley, female    DOB: [birth year], 46 y.o.   MRN: [id number]  HPI Presents with a 5 day history of dysuria frequency and suprapubic pressure No fever/no vaginal discharge/Mirena for contraception/no unusual vaginal bleeding Past history of one urinary tract infection   Review of Systems Patient Active Problem List  Diagnoses  . ANEMIA-IRON DEFICIENCY  . HYPERTENSION, BENIGN ESSENTIAL  . UTI  . Screening for malignant neoplasm of the cervix  . Routine gynecological examination  These problems are stable    Physical examination: Vital signs are stable except for obesity The abdomen is soft and nontender No CVA tenderness to percussion   Results for orders placed in visit on 07/26/11  POCT UA - MICROSCOPIC ONLY      Component Value Range   WBC, Ur, HPF, POC 6-7     RBC, urine, microscopic 2-3     Bacteria, U Microscopic 1+     Mucus, UA neg     Epithelial cells, urine per micros 3-5     Crystals, Ur, HPF, POC neg     Casts, Ur, LPF, POC neg     Yeast, UA neg       Objective:   Physical Exam As above   Impression: Problem #1 urinary tract infection  Plan: Culture Cipro 250 twice a day #10 Followup if symptoms get worse

## 2011-07-27 ENCOUNTER — Encounter: Payer: Self-pay | Admitting: Internal Medicine

## 2011-07-28 ENCOUNTER — Telehealth: Payer: Self-pay

## 2011-07-28 LAB — URINE CULTURE: Colony Count: 55000

## 2011-07-28 NOTE — Telephone Encounter (Signed)
Pt states cipro is not working and would like something else called in for her

## 2011-07-29 ENCOUNTER — Telehealth: Payer: Self-pay

## 2011-07-29 ENCOUNTER — Telehealth: Payer: Self-pay | Admitting: Family Medicine

## 2011-07-29 NOTE — Telephone Encounter (Signed)
Spoke with patient patient want something new called in for her the Cipro isn't working at all

## 2011-07-29 NOTE — Telephone Encounter (Signed)
Urine culture did not identify a specific cause so we don't have an alternative to use-she needs to return for recheck tommorrow to be sure the diagnosis is correct

## 2011-07-29 NOTE — Telephone Encounter (Signed)
LMOM notfiying patient info below.

## 2011-07-30 ENCOUNTER — Telehealth: Payer: Self-pay | Admitting: Family Medicine

## 2011-07-30 NOTE — Telephone Encounter (Signed)
error 

## 2011-07-30 NOTE — Telephone Encounter (Signed)
Called pt to advise instructions, pt understood and will finish the cipro, pt asked about a concern with her mirana per thinks she noted a string coming out when she urinated, pt notes that she can not reach per inserted to "deeply" pt verified that she has a GYN, I advised that she will need to contact her GYN with that concern, pt understood, will call GYN

## 2011-07-30 NOTE — Telephone Encounter (Signed)
[facility] Triage Call Report Triage Record Num: [id number] Operator: [place] [employer] Patient Name: Jeanette Kelley Call Date & Time: 07/29/2011 5:27:05PM Patient Phone: [phone] PCP: Lezlie Octave Patient Gender: Female PCP Fax : [phone] Patient DOB: 1965-07-14 Practice Name: [facility] Reason for Call: Caller: Jaspreet/Patient; PCP: Sheliah Hatch.; CB#: [phone]; ; ; Call regarding Med. Question for UTI; Inquiring about which antibiotic she took previously for UTI; Onset of frequency 07/20/11; Denies burning or pain on urination; Lower abdominal area discomfort; Afebrile; was seen at [facility] on 07/26/11 and was given Cipro; stopped taking Cipro on 07/28/11 because the provider she saw said she would know in a "few days" if the Cipro was working; pt states that she has had some improvement; All emergent sx r/o per Urinary Symptoms - Female. Disposition of see Provider in 2 weeks overridden d/t pt not finishing antibiotic. Pt advised to continue Cipro as prescribed and home care advice given. Pt advised to call Provider if sx persist after completion of antibiotic. Protocol(s) Used: Urinary Symptoms - Female Recommended Outcome per Protocol: See Provider within 2 Weeks Override Outcome if Used in Protocol: Provide Home/Self Care RN Reason for Override Outcome: Nursing Judgement Used. Reason for Outcome: Frequent urination without other symptoms Care Advice: Drink 8-12 eight ounce (1.6 - 2.4 L) glasses of liquid each day to keep the urine from becoming too concentrated. Concentrated urine irritates the bladder and can lead to urgency. ~ Call provider if you develop vomiting, abdominal pain, or symptoms of dehydration (extreme thirst, dry mouth and tongue, dry skin or decreased skin elasticity, restlessness, irritability, rapid, weak pulse, no urination in 8 hours, feeling faint or weak). ~ ~ Do not change prescribed medications, dosing regimen, or other  treatments until consulting with your provider. ~ Call provider if symptoms worsen, such as increasing pain in low back, pelvis, or side(s); blood in urine; or fever. Call provider if you develop frequent urination, inability to urinate, pain or burning with urination, temperature greater than 100.5 F(38.6 C), or urine color is pink, red, smoky, or brownish-green. ~ Do not go for long periods without urinating; empty your bladder completely when you urinate. To prevent bladder infections, it is a good practice to empty your bladder before and within 15 minutes after intercourse. Taking a cranberry tablet daily or one glass of unsweetened cranberry juice daily can assist in prevention of UTIs. ~ ~ HEALTH PROMOTION / MAINTENANCE ~ SYMPTOM / CONDITION MANAGEMENT ~ CAUTIONS ~ Empty your bladder completely every 2 to 3 hours when possible, and before going to bed. Limit carbonated, alcoholic, and caffeinated beverages such as coffee, tea and soda. Avoid nonprescription cold and allergy medications that contain caffeine. Limit intake of tomatoes, fruit juices (except for unsweetened cranberry juice), dairy products, spicy foods, sugar, and artificial sweeteners (aspartame or saccharine). Stop or decrease smoking. Reducing exposure to bladder irritants may help lessen urgency. ~ ~ Drink liquids throughout waking hours; limit fluids 2 to 3 hours before your usual bedtime

## 2011-07-30 NOTE — Telephone Encounter (Signed)
Should finish Cipro as directed at [facility]

## 2011-07-31 NOTE — Telephone Encounter (Signed)
Agree w/ calling GYN

## 2011-10-01 ENCOUNTER — Other Ambulatory Visit: Payer: Self-pay | Admitting: Obstetrics and Gynecology

## 2011-10-01 DIAGNOSIS — Z1231 Encounter for screening mammogram for malignant neoplasm of breast: Secondary | ICD-10-CM

## 2011-10-15 ENCOUNTER — Ambulatory Visit
Admission: RE | Admit: 2011-10-15 | Discharge: 2011-10-15 | Disposition: A | Discharge status: Home / Self Care | Payer: PRIVATE HEALTH INSURANCE | Source: Ambulatory Visit | Attending: Obstetrics and Gynecology | Admitting: Obstetrics and Gynecology

## 2011-10-15 ENCOUNTER — Ambulatory Visit: Payer: BC Managed Care – PPO

## 2011-10-15 DIAGNOSIS — Z1231 Encounter for screening mammogram for malignant neoplasm of breast: Secondary | ICD-10-CM

## 2011-11-04 ENCOUNTER — Encounter: Payer: Self-pay | Admitting: Obstetrics and Gynecology

## 2011-12-16 ENCOUNTER — Encounter: Payer: Self-pay | Admitting: Family Medicine

## 2011-12-16 ENCOUNTER — Ambulatory Visit (INDEPENDENT_AMBULATORY_CARE_PROVIDER_SITE_OTHER): Payer: PRIVATE HEALTH INSURANCE | Admitting: Family Medicine

## 2011-12-16 ENCOUNTER — Encounter: Payer: Self-pay | Admitting: *Deleted

## 2011-12-16 VITALS — BP 134/78 | HR 67 | Temp 97.8°F | Wt 183.0 lb

## 2011-12-16 DIAGNOSIS — R233 Spontaneous ecchymoses: Secondary | ICD-10-CM | POA: Insufficient documentation

## 2011-12-16 DIAGNOSIS — M545 Low back pain: Secondary | ICD-10-CM | POA: Insufficient documentation

## 2011-12-16 DIAGNOSIS — R238 Other skin changes: Secondary | ICD-10-CM | POA: Insufficient documentation

## 2011-12-16 LAB — CBC WITH DIFFERENTIAL/PLATELET
Basophils Absolute: 0.1 10*3/uL (ref 0.0–0.1)
Basophils Relative: 0.9 % (ref 0.0–3.0)
Eosinophils Absolute: 0.1 10*3/uL (ref 0.0–0.7)
Eosinophils Relative: 0.8 % (ref 0.0–5.0)
HCT: 38.3 % (ref 36.0–46.0)
Hemoglobin: 12.7 g/dL (ref 12.0–15.0)
Lymphocytes Relative: 45.4 % (ref 12.0–46.0)
Lymphs Abs: 3.3 10*3/uL (ref 0.7–4.0)
MCHC: 33.1 g/dL (ref 30.0–36.0)
MCV: 100.9 fl — ABNORMAL HIGH (ref 78.0–100.0)
Monocytes Absolute: 0.6 10*3/uL (ref 0.1–1.0)
Monocytes Relative: 8.9 % (ref 3.0–12.0)
Neutro Abs: 3.2 10*3/uL (ref 1.4–7.7)
Neutrophils Relative %: 44 % (ref 43.0–77.0)
Platelets: 276 10*3/uL (ref 150.0–400.0)
RBC: 3.8 Mil/uL — ABNORMAL LOW (ref 3.87–5.11)
RDW: 12.7 % (ref 11.5–14.6)
WBC: 7.2 10*3/uL (ref 4.5–10.5)

## 2011-12-16 LAB — BASIC METABOLIC PANEL
BUN: 11 mg/dL (ref 6–23)
CO2: 28 mEq/L (ref 19–32)
Calcium: 9.1 mg/dL (ref 8.4–10.5)
Chloride: 105 mEq/L (ref 96–112)
Creatinine, Ser: 0.7 mg/dL (ref 0.4–1.2)
GFR: 118.62 mL/min (ref >60.00)
Glucose, Bld: 93 mg/dL (ref 70–99)
Potassium: 3.6 mEq/L (ref 3.5–5.1)
Sodium: 139 mEq/L (ref 135–145)

## 2011-12-16 LAB — TSH: TSH: 1.66 u[IU]/mL (ref 0.35–5.50)

## 2011-12-16 LAB — HEPATIC FUNCTION PANEL
ALT: 22 U/L (ref 0–35)
AST: 25 U/L (ref 0–37)
Albumin: 4 g/dL (ref 3.5–5.2)
Alkaline Phosphatase: 71 U/L (ref 39–117)
Bilirubin, Direct: 0.1 mg/dL (ref 0.0–0.3)
Total Bilirubin: 0.6 mg/dL (ref 0.3–1.2)
Total Protein: 7.8 g/dL (ref 6.0–8.3)

## 2011-12-16 NOTE — Progress Notes (Signed)
  Subjective:    Patient ID: Jeanette Kelley, female    DOB: October 31, 1965, 46 y.o.   MRN: [id number]  HPI Easy bruising- noted for the last 1-2 months, occuring on bilateral LEs.  No bruising on arms, abd, back.  No hematuria, melena.  No nose bleeds.  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  Pain will radiate into upper thigh.  Will lock into R leg while standing.  Worse w/ standing.  Improves w/ sitting.  Pain will improve w/ tylenol.  Reports pain worsens when wearing sketchers shape-ups.    Review of Systems For ROS see HPI     Objective:   Physical Exam  Vitals reviewed. Constitutional: She appears well-developed and well-nourished. No distress.  HENT:  Head: Normocephalic and atraumatic.  Nose: Nose normal.  Mouth/Throat: Oropharynx is clear and moist. No oropharyngeal exudate.  Neck: Normal range of motion. Neck supple. No thyromegaly present.  Cardiovascular: Normal rate, regular rhythm and normal heart sounds.   Pulmonary/Chest: Effort normal and breath sounds normal. No respiratory distress. She has no wheezes. She has no rales.  Abdominal: Soft. Bowel sounds are normal. She exhibits no distension and no mass (no hepatosplenomegaly). There is no tenderness. There is no rebound.  Musculoskeletal: She exhibits tenderness (over lumbar paraspinals bilaterally). She exhibits no edema.       (-) SLR bilaterally Good flexion/extension of spine  Lymphadenopathy:    She has no cervical adenopathy.  Skin: Skin is warm and dry. No erythema.       Some mild bruising of lower legs bilaterally No bruising of thighs, abdomen, arms, back          Assessment & Plan:

## 2011-12-16 NOTE — Patient Instructions (Addendum)
Schedule your complete physical for later in September We'll notify you of your lab results and make any changes if needed Make sure you are wearing good supportive shoes for all the standing that you're doing Tylenol as needed for pain Use a heating pad for your back/hip when you get home Call with any questions or concerns Hang in there!!!

## 2011-12-17 NOTE — Assessment & Plan Note (Signed)
New.  Pt w/ mild bruising of LEs that would be consistent w/ impact bruising.  No atypical or excessive bruising seen.  Given pt's concerns, will check labs to r/o abnormality but provided reassurance that this is likely normal and nothing to worry about.

## 2011-12-17 NOTE — Assessment & Plan Note (Signed)
New.  Most likely positional as pt stands for long hours.  No red flags on hx or PE.  Recommended supportive shoes, tylenol, heat/ice prn.  Suggested seeing sports med for orthotics but pt declined.  Will follow.

## 2012-01-10 ENCOUNTER — Encounter: Payer: Self-pay | Admitting: Family Medicine

## 2012-01-10 ENCOUNTER — Telehealth: Payer: Self-pay | Admitting: Family Medicine

## 2012-01-10 ENCOUNTER — Ambulatory Visit (INDEPENDENT_AMBULATORY_CARE_PROVIDER_SITE_OTHER): Payer: PRIVATE HEALTH INSURANCE | Admitting: Family Medicine

## 2012-01-10 VITALS — BP 120/88 | HR 87 | Temp 98.4°F | Resp 16 | Ht 61.0 in | Wt 180.2 lb

## 2012-01-10 DIAGNOSIS — N390 Urinary tract infection, site not specified: Secondary | ICD-10-CM

## 2012-01-10 DIAGNOSIS — N3090 Cystitis, unspecified without hematuria: Secondary | ICD-10-CM

## 2012-01-10 LAB — POCT URINALYSIS DIPSTICK
Leukocytes, UA: NEGATIVE
Nitrite, UA: NEGATIVE
Protein, UA: NEGATIVE
Urobilinogen, UA: 0.2
pH, UA: 6

## 2012-01-10 NOTE — Progress Notes (Signed)
  Subjective:    Patient ID: Jeanette Kelley, female    DOB: [birth year], 46 y.o.   MRN: [id number]  HPI Dysuria/frequency- sxs started last night.  Was having pain on R side.  Took tylenol last night and sxs improved.  Drinking water.  No back pain.  No urgency.   Review of Systems For ROS see HPI     Objective:   Physical Exam  Vitals reviewed. Constitutional: She appears well-developed and well-nourished. No distress.  Abdominal: Soft. She exhibits no distension. There is no tenderness (no suprapubic or CVA tenderness).  Musculoskeletal: She exhibits no edema and no tenderness (over low back).          Assessment & Plan:

## 2012-01-10 NOTE — Patient Instructions (Addendum)
Continue to drink plenty of fluids Tylenol as needed We'll notify you of your urine culture results and start meds as needed Have a great weekend!!!

## 2012-01-10 NOTE — Telephone Encounter (Signed)
Called pt to offer apt at 2pm today, pt advised that she will need to review her work schedule per already has clients set up at that time, advised about Saturday clinic hours/location/times, pt understood and will call office back to schedule if possible

## 2012-01-10 NOTE — Telephone Encounter (Signed)
Ok to come at 2:00 to give urine and determine whether abx are needed (can double book)

## 2012-01-10 NOTE — Telephone Encounter (Signed)
Caller: Jeanette Kelley/Patient; Patient Name: Jeanette Kelley; PCP: Sheliah Hatch.; Best Callback Phone Number: [phone]; Call regarding Bladder pressure with frequency, onset 9-12.  Afebrile.  All emergent symptoms ruled out per Urinary Symptoms, see in 24 hours.  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  Please call Patient back to schedule appointment, Patient interested in script to be called in if an appointment isn't possible any sooner than 1545 or 1600, [facility], [address], [phone].

## 2012-01-12 NOTE — Assessment & Plan Note (Signed)
UA w/out evidence of infxn.  Atypical for infxn as tylenol improved sxs.  More likely abdominal wall pain/strain than UTI.  No abx at this time.  Reviewed supportive care and red flags that should prompt return.  Pt expressed understanding and is in agreement w/ plan.

## 2012-01-27 ENCOUNTER — Other Ambulatory Visit: Payer: Self-pay | Admitting: Family Medicine

## 2012-01-27 MED ORDER — HYDROCHLOROTHIAZIDE 12.5 MG PO CAPS: 12.5000 mg | ORAL_CAPSULE | Freq: Every day | ORAL | Status: DC

## 2012-01-27 NOTE — Telephone Encounter (Signed)
rx sent to pharmacy by e-script  

## 2012-01-27 NOTE — Telephone Encounter (Signed)
refill Hydrochlorothiazide (Cap) 12.5 MG Take 1 capsule (12.5 mg total) by mouth daily #30 last fill 8.26.13  last ov 9.13.13 acute

## 2012-07-20 ENCOUNTER — Ambulatory Visit (INDEPENDENT_AMBULATORY_CARE_PROVIDER_SITE_OTHER): Payer: PRIVATE HEALTH INSURANCE | Admitting: Family Medicine

## 2012-07-20 ENCOUNTER — Encounter: Payer: Self-pay | Admitting: Family Medicine

## 2012-07-20 VITALS — BP 110/90 | HR 80 | Temp 97.6°F | Ht 60.25 in | Wt 182.0 lb

## 2012-07-20 DIAGNOSIS — N390 Urinary tract infection, site not specified: Secondary | ICD-10-CM

## 2012-07-20 DIAGNOSIS — R141 Gas pain: Secondary | ICD-10-CM

## 2012-07-20 DIAGNOSIS — R142 Eructation: Secondary | ICD-10-CM

## 2012-07-20 DIAGNOSIS — N898 Other specified noninflammatory disorders of vagina: Secondary | ICD-10-CM

## 2012-07-20 LAB — POCT URINALYSIS DIPSTICK
Bilirubin, UA: NEGATIVE
Ketones, UA: NEGATIVE
Protein, UA: NEGATIVE
Spec Grav, UA: 1.02
pH, UA: 6

## 2012-07-20 MED ORDER — CEPHALEXIN 500 MG PO CAPS: 500.0000 mg | ORAL_CAPSULE | Freq: Two times a day (BID) | ORAL | Status: AC

## 2012-07-20 MED ORDER — FLUCONAZOLE 150 MG PO TABS: 150.0000 mg | ORAL_TABLET | Freq: Once | ORAL | Status: DC

## 2012-07-20 NOTE — Progress Notes (Signed)
  Subjective:    Patient ID: Jeanette Kelley, female    DOB: 07/21/1965, 47 y.o.   MRN: [id number]  HPI UTI- 'a couple of weeks ago' had 'pain on this side (L)'.  Thought it was UTI but sxs improved.  sxs have been intermittent- then pt thought it was gas pain.  Started Probiotic.  No dysuria.  Mild frequency.  No urgency.  Vaginal D/C- a few days ago developed vaginal d/c.  No vaginal itching but 'it's like a white cottage cheese'.  No new sex partners.  No odor.  Gas pain- pt reports pain in LLQ that will only occur after eating 'beans, broccoli, cabbage'.  Pain described as a 'strong cramp'.  'it gets real tight'.   No N/V/D.  Episodes are intermittent.   Review of Systems For ROS see HPI     Objective:   Physical Exam  Vitals reviewed. Constitutional: She is oriented to person, place, and time. She appears well-developed and well-nourished. No distress.  HENT:  Head: Normocephalic and atraumatic.  Abdominal: Soft. Bowel sounds are normal. She exhibits no distension and no mass. There is no tenderness. There is no rebound and no guarding.  Genitourinary: Vaginal discharge (thick, white d/c w/out obvious odor) found.  Neurological: She is alert and oriented to person, place, and time.  Skin: Skin is warm and dry.  Psychiatric: She has a normal mood and affect. Her behavior is normal.          Assessment & Plan:

## 2012-07-20 NOTE — Patient Instructions (Signed)
Schedule your physical at your convenience- we're overdue Start the Keflex twice daily for UTI Drink plenty of fluids Take the Diflucan for suspected yeast Your abdominal pain is consistent w/ gas pain- take Beano if needed for gas pain/pressure Continue the Probiotics Call with any questions or concerns Hang in there!

## 2012-07-20 NOTE — Assessment & Plan Note (Signed)
Pt's UA suspicious for infxn despite pt's lack of sxs (other than suprapubic pressure).  Empirically tx w/ Keflex.  Reviewed supportive care and red flags that should prompt return.  Pt expressed understanding and is in agreement w/ plan.

## 2012-07-20 NOTE — Assessment & Plan Note (Signed)
New.  Likely all dietary related.  No abnormality on PE.  Reviewed possibly gassy foods.  Encouraged her to continue probiotic (but cautioned that this can also transiently increase gas and bloating) and Beano prn.  Reviewed supportive care and red flags that should prompt return.  Pt expressed understanding and is in agreement w/ plan.

## 2012-07-20 NOTE — Assessment & Plan Note (Signed)
New.  Consistent w/ yeast on PE.  Empirically start Diflucan and await wet prep results.

## 2012-07-21 LAB — WET PREP BY MOLECULAR PROBE: Candida species: POSITIVE — AB

## 2012-07-22 LAB — URINE CULTURE

## 2012-08-06 ENCOUNTER — Other Ambulatory Visit: Payer: Self-pay | Admitting: Family Medicine

## 2012-09-22 ENCOUNTER — Other Ambulatory Visit: Payer: Self-pay | Admitting: Family Medicine

## 2012-09-22 NOTE — Telephone Encounter (Signed)
Med filled.  

## 2012-11-16 ENCOUNTER — Other Ambulatory Visit (HOSPITAL_COMMUNITY)
Admission: RE | Admit: 2012-11-16 | Discharge: 2012-11-16 | Disposition: A | Discharge status: Home / Self Care | Payer: BC Managed Care – PPO | Source: Ambulatory Visit | Attending: Family Medicine | Admitting: Family Medicine

## 2012-11-16 ENCOUNTER — Ambulatory Visit (INDEPENDENT_AMBULATORY_CARE_PROVIDER_SITE_OTHER): Payer: BC Managed Care – PPO | Admitting: Family Medicine

## 2012-11-16 ENCOUNTER — Encounter: Payer: Self-pay | Admitting: Family Medicine

## 2012-11-16 VITALS — BP 108/78 | HR 70 | Temp 98.2°F | Ht 60.25 in | Wt 183.8 lb

## 2012-11-16 DIAGNOSIS — Z1151 Encounter for screening for human papillomavirus (HPV): Secondary | ICD-10-CM | POA: Insufficient documentation

## 2012-11-16 DIAGNOSIS — D509 Iron deficiency anemia, unspecified: Secondary | ICD-10-CM

## 2012-11-16 DIAGNOSIS — N760 Acute vaginitis: Secondary | ICD-10-CM | POA: Insufficient documentation

## 2012-11-16 DIAGNOSIS — Z01419 Encounter for gynecological examination (general) (routine) without abnormal findings: Secondary | ICD-10-CM

## 2012-11-16 DIAGNOSIS — I10 Essential (primary) hypertension: Secondary | ICD-10-CM

## 2012-11-16 DIAGNOSIS — Z124 Encounter for screening for malignant neoplasm of cervix: Secondary | ICD-10-CM

## 2012-11-16 LAB — CBC WITH DIFFERENTIAL/PLATELET
Basophils Absolute: 0 10*3/uL (ref 0.0–0.1)
Eosinophils Relative: 1 % (ref 0.0–5.0)
Hemoglobin: 12.6 g/dL (ref 12.0–15.0)
Lymphocytes Relative: 47.7 % — ABNORMAL HIGH (ref 12.0–46.0)
Monocytes Relative: 8.8 % (ref 3.0–12.0)
Neutro Abs: 2.5 10*3/uL (ref 1.4–7.7)
RBC: 3.7 Mil/uL — ABNORMAL LOW (ref 3.87–5.11)
RDW: 12.4 % (ref 11.5–14.6)
WBC: 6 10*3/uL (ref 4.5–10.5)

## 2012-11-16 LAB — LDL CHOLESTEROL, DIRECT: Direct LDL: 146.9 mg/dL

## 2012-11-16 LAB — BASIC METABOLIC PANEL
CO2: 29 mEq/L (ref 19–32)
Chloride: 104 mEq/L (ref 96–112)
Creatinine, Ser: 0.8 mg/dL (ref 0.4–1.2)
Glucose, Bld: 92 mg/dL (ref 70–99)
Sodium: 138 mEq/L (ref 135–145)

## 2012-11-16 LAB — HEPATIC FUNCTION PANEL
ALT: 15 U/L (ref 0–35)
Albumin: 4 g/dL (ref 3.5–5.2)
Total Protein: 8 g/dL (ref 6.0–8.3)

## 2012-11-16 LAB — LIPID PANEL
Cholesterol: 209 mg/dL — ABNORMAL HIGH (ref 0–200)
Triglycerides: 57 mg/dL (ref 0.0–149.0)

## 2012-11-16 NOTE — Patient Instructions (Addendum)
Follow up in 6 months to recheck BP We'll notify you of your lab results and make any changes if needed Call with any questions or concerns Have a great summer!!!

## 2012-11-16 NOTE — Assessment & Plan Note (Signed)
Pt's PE WNL.  Encouraged pt to schedule mammo.  Will be due for colonoscopy next year.  Pap collected today.  Check labs.  Anticipatory guidance provided.

## 2012-11-16 NOTE — Progress Notes (Signed)
  Subjective:    Patient ID: Jeanette Kelley, female    DOB: 07/06/1965, 47 y.o.   MRN: [id number]  HPI CPE- due for mammo [facility]).  Mirena inserted by Dr Stefano Gaul 2 yrs ago.   Review of Systems Patient reports no vision/ hearing changes, adenopathy,fever, weight change,  persistant/recurrent hoarseness , swallowing issues, chest pain, palpitations, edema, persistant/recurrent cough, hemoptysis, dyspnea (rest/exertional/paroxysmal nocturnal), gastrointestinal bleeding (melena, rectal bleeding), abdominal pain, significant heartburn, bowel changes, GU symptoms (dysuria, hematuria, incontinence), Gyn symptoms (abnormal  bleeding, pain),  syncope, focal weakness, memory loss, numbness & tingling, skin/hair/nail changes, abnormal bruising or bleeding, anxiety, or depression.     Objective:   Physical Exam  General Appearance:    Alert, cooperative, no distress, appears stated age  Head:    Normocephalic, without obvious abnormality, atraumatic  Eyes:    PERRL, conjunctiva/corneas clear, EOM's intact, fundi    benign, both eyes  Ears:    Normal TM's and external ear canals, both ears  Nose:   Nares normal, septum midline, mucosa normal, no drainage    or sinus tenderness  Throat:   Lips, mucosa, and tongue normal; teeth and gums normal  Neck:   Supple, symmetrical, trachea midline, no adenopathy;    Thyroid: no enlargement/tenderness/nodules  Back:     Symmetric, no curvature, ROM normal, no CVA tenderness  Lungs:     Clear to auscultation bilaterally, respirations unlabored  Chest Wall:    No tenderness or deformity   Heart:    Regular rate and rhythm, S1 and S2 normal, no murmur, rub   or gallop  Breast Exam:    No tenderness, masses, or nipple abnormality  Abdomen:     Soft, non-tender, bowel sounds active all four quadrants,    no masses, no organomegaly  Genitalia:    External genitalia normal, cervix normal in appearance, no CMT, uterus in normal size and position, adnexa  w/out mass or tenderness, mucosa pink and moist, no lesions, scant malodorous vaginal D/C  Rectal:    Normal external appearance  Extremities:   Extremities normal, atraumatic, no cyanosis or edema  Pulses:   2+ and symmetric all extremities  Skin:   Skin color, texture, turgor normal, no rashes or lesions  Lymph nodes:   Cervical, supraclavicular, and axillary nodes normal  Neurologic:   CNII-XII intact, normal strength, sensation and reflexes    throughout          Assessment & Plan:

## 2012-11-16 NOTE — Assessment & Plan Note (Signed)
Pap collected. 

## 2012-11-17 ENCOUNTER — Other Ambulatory Visit: Payer: Self-pay

## 2012-11-17 ENCOUNTER — Encounter: Payer: Self-pay | Admitting: *Deleted

## 2012-11-17 DIAGNOSIS — Z1231 Encounter for screening mammogram for malignant neoplasm of breast: Secondary | ICD-10-CM

## 2012-11-20 LAB — VITAMIN D 1,25 DIHYDROXY: Vitamin D2 1, 25 (OH)2: <8 pg/mL

## 2012-12-04 ENCOUNTER — Telehealth: Payer: Self-pay | Admitting: *Deleted

## 2012-12-04 ENCOUNTER — Other Ambulatory Visit: Payer: Self-pay | Admitting: *Deleted

## 2012-12-04 DIAGNOSIS — B373 Candidiasis of vulva and vagina: Secondary | ICD-10-CM

## 2012-12-04 MED ORDER — FLUCONAZOLE 150 MG PO TABS: 150.0000 mg | ORAL_TABLET | Freq: Once | ORAL | Status: DC

## 2012-12-04 NOTE — Telephone Encounter (Signed)
Advised patient of the following results and recommendations of the provider. Pt understood. No question or concerns //SW//CMA   Notes Recorded by Sheliah Hatch, MD on 11/17/2012 at 7:43 AM Labs look good. LDL is higher than goal but not high enough to start meds. Will improve w/ healthy diet and regular exercise. Will follow. Normal Vit D Pt has abnormal pap- needs to see GYN ASAP for complete evaluation and tx + for yeast. Start Diflucan 150mg  x1 dose

## 2012-12-04 NOTE — Telephone Encounter (Signed)
Rx for dilflucan [facility].   Letter sent to pt advising of abnormal pap and to follow up with GYN. Several telephone calls have been made to inform pt with messages left.

## 2012-12-07 ENCOUNTER — Telehealth: Payer: Self-pay | Admitting: Family Medicine

## 2012-12-07 NOTE — Telephone Encounter (Signed)
Pap results faxed

## 2012-12-07 NOTE — Telephone Encounter (Signed)
Patient has upcoming appt with Dr. Stefano Gaul to discuss abnormal pap smear. Please send results of last pap smear to [facility] attn: Dr. Stefano Gaul. Fax # [phone]

## 2012-12-08 ENCOUNTER — Ambulatory Visit
Admission: RE | Admit: 2012-12-08 | Discharge: 2012-12-08 | Disposition: A | Discharge status: Home / Self Care | Payer: BC Managed Care – PPO | Source: Ambulatory Visit

## 2012-12-08 DIAGNOSIS — Z1231 Encounter for screening mammogram for malignant neoplasm of breast: Secondary | ICD-10-CM

## 2012-12-29 ENCOUNTER — Other Ambulatory Visit: Payer: Self-pay | Admitting: Obstetrics and Gynecology

## 2013-02-15 ENCOUNTER — Other Ambulatory Visit: Payer: Self-pay | Admitting: Family Medicine

## 2013-02-15 NOTE — Telephone Encounter (Signed)
Med filled.  

## 2013-03-17 NOTE — Telephone Encounter (Signed)
error 

## 2013-07-03 ENCOUNTER — Other Ambulatory Visit: Payer: Self-pay | Admitting: Family Medicine

## 2013-07-05 NOTE — Telephone Encounter (Signed)
Med filled.  

## 2013-08-23 ENCOUNTER — Other Ambulatory Visit: Payer: Self-pay | Admitting: Family Medicine

## 2013-08-23 NOTE — Telephone Encounter (Signed)
Med filled.  

## 2013-10-13 ENCOUNTER — Other Ambulatory Visit: Payer: Self-pay | Admitting: Family Medicine

## 2013-10-13 NOTE — Telephone Encounter (Signed)
Med filled and letter mailed to pt to schedule a BP appt.

## 2013-12-27 ENCOUNTER — Telehealth: Payer: Self-pay | Admitting: Family Medicine

## 2013-12-27 ENCOUNTER — Ambulatory Visit: Payer: BC Managed Care – PPO | Admitting: Family Medicine

## 2013-12-27 MED ORDER — HYDROCHLOROTHIAZIDE 12.5 MG PO CAPS: ORAL_CAPSULE | ORAL | Status: DC

## 2013-12-27 NOTE — Telephone Encounter (Signed)
Closed in error.

## 2013-12-27 NOTE — Telephone Encounter (Signed)
Med filled.  

## 2013-12-27 NOTE — Addendum Note (Signed)
Addended by: Jackson Latino on: 12/27/2013 02:35 PM   Modules accepted: Orders

## 2013-12-27 NOTE — Telephone Encounter (Signed)
Patient had appt today with dr. Beverely Low and went to the [facility] location. Patient is requesting enough hydrochlorothiazide to last her until she is seen. Please send rx to [facility] on [address]

## 2014-01-10 ENCOUNTER — Ambulatory Visit: Payer: BC Managed Care – PPO | Admitting: Family Medicine

## 2014-01-10 ENCOUNTER — Encounter: Payer: Self-pay | Admitting: Family Medicine

## 2014-01-10 ENCOUNTER — Ambulatory Visit (INDEPENDENT_AMBULATORY_CARE_PROVIDER_SITE_OTHER): Payer: No Typology Code available for payment source | Admitting: Family Medicine

## 2014-01-10 VITALS — BP 138/80 | HR 83 | Temp 98.2°F | Resp 16 | Wt 177.4 lb

## 2014-01-10 DIAGNOSIS — I10 Essential (primary) hypertension: Secondary | ICD-10-CM

## 2014-01-10 DIAGNOSIS — E785 Hyperlipidemia, unspecified: Secondary | ICD-10-CM

## 2014-01-10 LAB — HEPATIC FUNCTION PANEL
ALBUMIN: 4.1 g/dL (ref 3.5–5.2)
ALK PHOS: 77 U/L (ref 39–117)
ALT: 20 U/L (ref 0–35)
AST: 25 U/L (ref 0–37)
Bilirubin, Direct: 0.1 mg/dL (ref 0.0–0.3)
TOTAL PROTEIN: 8.3 g/dL (ref 6.0–8.3)
Total Bilirubin: 0.6 mg/dL (ref 0.2–1.2)

## 2014-01-10 LAB — CBC WITH DIFFERENTIAL/PLATELET
BASOS ABS: 0.1 10*3/uL (ref 0.0–0.1)
Basophils Relative: 0.9 % (ref 0.0–3.0)
EOS PCT: 0.4 % (ref 0.0–5.0)
Eosinophils Absolute: 0 10*3/uL (ref 0.0–0.7)
HCT: 37.2 % (ref 36.0–46.0)
HEMOGLOBIN: 12.2 g/dL (ref 12.0–15.0)
LYMPHS PCT: 33.7 % (ref 12.0–46.0)
Lymphs Abs: 3.4 10*3/uL (ref 0.7–4.0)
MCHC: 32.9 g/dL (ref 30.0–36.0)
MCV: 99.9 fl (ref 78.0–100.0)
MONOS PCT: 6.6 % (ref 3.0–12.0)
Monocytes Absolute: 0.7 10*3/uL (ref 0.1–1.0)
Neutro Abs: 6 10*3/uL (ref 1.4–7.7)
Neutrophils Relative %: 58.4 % (ref 43.0–77.0)
PLATELETS: 304 10*3/uL (ref 150.0–400.0)
RBC: 3.72 Mil/uL — ABNORMAL LOW (ref 3.87–5.11)
RDW: 12.6 % (ref 11.5–15.5)
WBC: 10.2 10*3/uL (ref 4.0–10.5)

## 2014-01-10 LAB — LIPID PANEL
CHOLESTEROL: 206 mg/dL — AB (ref 0–200)
HDL: 52.8 mg/dL (ref >39.00)
LDL CALC: 142 mg/dL — AB (ref 0–99)
NonHDL: 153.2
TRIGLYCERIDES: 58 mg/dL (ref 0.0–149.0)
Total CHOL/HDL Ratio: 4
VLDL: 11.6 mg/dL (ref 0.0–40.0)

## 2014-01-10 LAB — BASIC METABOLIC PANEL
BUN: 10 mg/dL (ref 6–23)
CALCIUM: 9 mg/dL (ref 8.4–10.5)
CO2: 24 meq/L (ref 19–32)
Chloride: 104 mEq/L (ref 96–112)
Creatinine, Ser: 0.9 mg/dL (ref 0.4–1.2)
GFR: 82.97 mL/min (ref >60.00)
GLUCOSE: 90 mg/dL (ref 70–99)
Potassium: 3.4 mEq/L — ABNORMAL LOW (ref 3.5–5.1)
SODIUM: 138 meq/L (ref 135–145)

## 2014-01-10 MED ORDER — HYDROCHLOROTHIAZIDE 12.5 MG PO CAPS: ORAL_CAPSULE | ORAL | Status: DC

## 2014-01-10 NOTE — Assessment & Plan Note (Signed)
Chronic problem.  Pt stopped meds 2 weeks ago when she ran out.  Currently asymptomatic.  No anticipated med changes.  Will follow.

## 2014-01-10 NOTE — Assessment & Plan Note (Signed)
Noted on last labs.  Pt was to work on healthy diet and regular exercise.  Due for repeat labs.  Will start meds prn.

## 2014-01-10 NOTE — Progress Notes (Signed)
   Subjective:    Patient ID: Jeanette Kelley, female    DOB: [birth year], 48 y.o.   MRN: [id number]  HPI HTN- chronic problem, on HCTZ.  Ran out of meds 2 weeks ago.  Home BPs '120 something'.  Denies CP, SOB, HAs, visual changes, edema.  Hyperlipidemia- noted on last lab check in 7/14.  Was to work on healthy diet and regular exercise.  Has lost 7 lbs since last visit.  Denies abd pain, N/V, myalgias.   Review of Systems For ROS see HPI     Objective:   Physical Exam  Vitals reviewed. Constitutional: She is oriented to person, place, and time. She appears well-developed and well-nourished. No distress.  HENT:  Head: Normocephalic and atraumatic.  Eyes: Conjunctivae and EOM are normal. Pupils are equal, round, and reactive to light.  Neck: Normal range of motion. Neck supple. No thyromegaly present.  Cardiovascular: Normal rate, regular rhythm, normal heart sounds and intact distal pulses.   No murmur heard. Pulmonary/Chest: Effort normal and breath sounds normal. No respiratory distress.  Abdominal: Soft. She exhibits no distension. There is no tenderness.  Musculoskeletal: She exhibits no edema.  Lymphadenopathy:    She has no cervical adenopathy.  Neurological: She is alert and oriented to person, place, and time.  Skin: Skin is warm and dry.  Psychiatric: She has a normal mood and affect. Her behavior is normal.          Assessment & Plan:

## 2014-01-10 NOTE — Progress Notes (Signed)
Pre visit review using our clinic review tool, if applicable. No additional management support is needed unless otherwise documented below in the visit note. 

## 2014-01-10 NOTE — Patient Instructions (Signed)
Schedule your complete physical in 6 months We'll notify you of your lab results and make any changes if needed Restart the HCTZ daily for the blood pressure Keep up the good work on healthy diet and regular exercise! Call with any questions or concerns Happy Fall!!

## 2014-01-11 ENCOUNTER — Encounter: Payer: Self-pay | Admitting: General Practice

## 2014-02-08 ENCOUNTER — Other Ambulatory Visit: Payer: Self-pay

## 2014-02-08 DIAGNOSIS — Z1231 Encounter for screening mammogram for malignant neoplasm of breast: Secondary | ICD-10-CM

## 2014-02-21 ENCOUNTER — Ambulatory Visit
Admission: RE | Admit: 2014-02-21 | Discharge: 2014-02-21 | Disposition: A | Discharge status: Home / Self Care | Payer: PRIVATE HEALTH INSURANCE | Source: Ambulatory Visit

## 2014-02-21 DIAGNOSIS — Z1231 Encounter for screening mammogram for malignant neoplasm of breast: Secondary | ICD-10-CM

## 2014-02-28 ENCOUNTER — Encounter: Payer: Self-pay | Admitting: Family Medicine

## 2014-11-01 ENCOUNTER — Encounter: Payer: Self-pay | Admitting: Family Medicine

## 2014-11-01 ENCOUNTER — Ambulatory Visit (INDEPENDENT_AMBULATORY_CARE_PROVIDER_SITE_OTHER): Payer: 59 | Admitting: Family Medicine

## 2014-11-01 VITALS — BP 142/84 | HR 90 | Temp 98.1°F | Resp 16 | Ht 60.0 in | Wt 186.1 lb

## 2014-11-01 DIAGNOSIS — I10 Essential (primary) hypertension: Secondary | ICD-10-CM | POA: Diagnosis not present

## 2014-11-01 DIAGNOSIS — E785 Hyperlipidemia, unspecified: Secondary | ICD-10-CM | POA: Diagnosis not present

## 2014-11-01 LAB — CBC WITH DIFFERENTIAL/PLATELET
BASOS ABS: 0 10*3/uL (ref 0.0–0.1)
Basophils Relative: 0.5 % (ref 0.0–3.0)
EOS ABS: 0.1 10*3/uL (ref 0.0–0.7)
Eosinophils Relative: 1.9 % (ref 0.0–5.0)
HCT: 36.5 % (ref 36.0–46.0)
Hemoglobin: 12.1 g/dL (ref 12.0–15.0)
Lymphocytes Relative: 46.7 % — ABNORMAL HIGH (ref 12.0–46.0)
Lymphs Abs: 3.1 10*3/uL (ref 0.7–4.0)
MCHC: 33.1 g/dL (ref 30.0–36.0)
MCV: 99.8 fl (ref 78.0–100.0)
Monocytes Absolute: 0.5 10*3/uL (ref 0.1–1.0)
Monocytes Relative: 7.6 % (ref 3.0–12.0)
NEUTROS PCT: 43.3 % (ref 43.0–77.0)
Neutro Abs: 2.9 10*3/uL (ref 1.4–7.7)
Platelets: 261 10*3/uL (ref 150.0–400.0)
RBC: 3.65 Mil/uL — AB (ref 3.87–5.11)
RDW: 12.5 % (ref 11.5–15.5)
WBC: 6.7 10*3/uL (ref 4.0–10.5)

## 2014-11-01 LAB — BASIC METABOLIC PANEL
BUN: 13 mg/dL (ref 6–23)
CO2: 28 mEq/L (ref 19–32)
Calcium: 8.9 mg/dL (ref 8.4–10.5)
Chloride: 102 mEq/L (ref 96–112)
Creatinine, Ser: 0.78 mg/dL (ref 0.40–1.20)
GFR: 100.06 mL/min (ref >60.00)
GLUCOSE: 84 mg/dL (ref 70–99)
POTASSIUM: 3.5 meq/L (ref 3.5–5.1)
Sodium: 136 mEq/L (ref 135–145)

## 2014-11-01 LAB — HEPATIC FUNCTION PANEL
ALBUMIN: 3.8 g/dL (ref 3.5–5.2)
ALT: 18 U/L (ref 0–35)
AST: 24 U/L (ref 0–37)
Alkaline Phosphatase: 61 U/L (ref 39–117)
BILIRUBIN TOTAL: 0.6 mg/dL (ref 0.2–1.2)
Bilirubin, Direct: 0.1 mg/dL (ref 0.0–0.3)
Total Protein: 7.5 g/dL (ref 6.0–8.3)

## 2014-11-01 LAB — TSH: TSH: 1.32 u[IU]/mL (ref 0.35–4.50)

## 2014-11-01 LAB — LIPID PANEL
CHOLESTEROL: 194 mg/dL (ref 0–200)
HDL: 51.1 mg/dL (ref >39.00)
LDL Cholesterol: 133 mg/dL — ABNORMAL HIGH (ref 0–99)
NonHDL: 142.9
TRIGLYCERIDES: 51 mg/dL (ref 0.0–149.0)
Total CHOL/HDL Ratio: 4
VLDL: 10.2 mg/dL (ref 0.0–40.0)

## 2014-11-01 MED ORDER — HYDROCHLOROTHIAZIDE 12.5 MG PO CAPS: ORAL_CAPSULE | ORAL | Status: DC

## 2014-11-01 NOTE — Assessment & Plan Note (Signed)
Chronic problem.  BP is elevated for pt today. Asymptomatic.  Pt admits to high stress levels.  Check labs.  No med changes at this time but will have pt follow up in short order to ensure that BP is not climbing.  Reviewed dietary and lifestyle modifications that will improve BP.  Reviewed supportive care and red flags that should prompt return.  Pt expressed understanding and is in agreement w/ plan.

## 2014-11-01 NOTE — Patient Instructions (Signed)
Follow up in 3-4 weeks to recheck BP We'll notify you of your lab results and make any changes if needed Drink plenty of water, get regular exercise, and make healthy food choices Call with any questions or concerns Happy 4th of July!!!

## 2014-11-01 NOTE — Progress Notes (Signed)
Pre visit review using our clinic review tool, if applicable. No additional management support is needed unless otherwise documented below in the visit note. 

## 2014-11-01 NOTE — Progress Notes (Signed)
   Subjective:    Patient ID: Jeanette Kelley, female    DOB: 01/07/1966, 49 y.o.   MRN: [id number]  HPI HTN- chronic problem, on HCTZ.  BP is elevated today for pt.  Denies CP, SOB, HAs, visual changes, edema.  Hyperlipidemia- chronic problem for pt.  Attempting to control w/ healthy diet and regular exercise.  Not currently on meds.  Has gained 9 lbs since last visit.  'i know i ain't been eating right'.  Pt has recently restarted her walking program.   Review of Systems For ROS see HPI     Objective:   Physical Exam  Constitutional: She is oriented to person, place, and time. She appears well-developed and well-nourished. No distress.  HENT:  Head: Normocephalic and atraumatic.  Eyes: Conjunctivae and EOM are normal. Pupils are equal, round, and reactive to light.  Neck: Normal range of motion. Neck supple. Thyromegaly (mild thyroid fullness) present.  Cardiovascular: Normal rate, regular rhythm, normal heart sounds and intact distal pulses.   No murmur heard. Pulmonary/Chest: Effort normal and breath sounds normal. No respiratory distress.  Abdominal: Soft. She exhibits no distension. There is no tenderness.  Musculoskeletal: She exhibits no edema.  Lymphadenopathy:    She has no cervical adenopathy.  Neurological: She is alert and oriented to person, place, and time.  Skin: Skin is warm and dry.  Psychiatric: She has a normal mood and affect. Her behavior is normal.  Vitals reviewed.         Assessment & Plan:

## 2014-11-01 NOTE — Assessment & Plan Note (Signed)
Ongoing issue for pt.  She would like to control w/ healthy diet and regular exercise but admits that she has not been doing well at either.  Repeat labs today and determine whether pt needs to start statin tx.  Pt expressed understanding and is in agreement w/ plan.

## 2014-11-02 ENCOUNTER — Encounter: Payer: Self-pay | Admitting: General Practice

## 2014-12-01 ENCOUNTER — Encounter: Payer: PRIVATE HEALTH INSURANCE | Admitting: Family Medicine

## 2014-12-12 ENCOUNTER — Telehealth: Payer: Self-pay | Admitting: Family Medicine

## 2014-12-12 ENCOUNTER — Ambulatory Visit: Payer: 59 | Admitting: Family Medicine

## 2014-12-16 NOTE — Telephone Encounter (Signed)
Yes charge for no show 

## 2014-12-16 NOTE — Telephone Encounter (Signed)
Pt was no show 12/12/14 10:45am, 3-4 week follow up, left msg for pt to call and reschedule, charge no show fee?

## 2015-06-05 ENCOUNTER — Other Ambulatory Visit: Payer: Self-pay

## 2015-06-05 DIAGNOSIS — Z1231 Encounter for screening mammogram for malignant neoplasm of breast: Secondary | ICD-10-CM

## 2015-06-13 ENCOUNTER — Ambulatory Visit
Admission: RE | Admit: 2015-06-13 | Discharge: 2015-06-13 | Disposition: A | Discharge status: Home / Self Care | Payer: BLUE CROSS/BLUE SHIELD | Source: Ambulatory Visit

## 2015-06-13 DIAGNOSIS — Z1231 Encounter for screening mammogram for malignant neoplasm of breast: Secondary | ICD-10-CM

## 2015-08-29 ENCOUNTER — Other Ambulatory Visit: Payer: Self-pay | Admitting: Family Medicine

## 2015-09-28 ENCOUNTER — Other Ambulatory Visit: Payer: Self-pay | Admitting: Family Medicine

## 2015-09-29 NOTE — Telephone Encounter (Signed)
Med denied, pt needs a BP follow up appt.  

## 2015-10-05 ENCOUNTER — Other Ambulatory Visit: Payer: Self-pay | Admitting: Family Medicine

## 2015-10-06 ENCOUNTER — Telehealth: Payer: Self-pay | Admitting: Family Medicine

## 2015-10-06 MED ORDER — HYDROCHLOROTHIAZIDE 12.5 MG PO CAPS: 12.5000 mg | ORAL_CAPSULE | Freq: Every day | ORAL | Status: AC

## 2015-10-06 NOTE — Telephone Encounter (Signed)
This medication refill request was denied today. Pt needs a BP follow up or Physical appointment.

## 2015-10-06 NOTE — Telephone Encounter (Signed)
Relation to ZO:XWRUpt:self Call back number:[phone] [facility] [place] [address] [phone] (Phone) [phone] (Fax)         Reason for call:  Patient requesting a refill hydrochlorothiazide (MICROZIDE) 12.5 MG capsule to hold her over until 12/05/15 until her new patient appointment with another PCP due to Dr. Beverely Lowabori location being to far.

## 2015-10-06 NOTE — Telephone Encounter (Signed)
Medication filled to pharmacy as requested.   

## 2016-09-05 IMAGING — MG MM SCREEN MAMMOGRAM BILATERAL
4 series · 4 of 4 positions shown · non-contrast
Comparison: Previous exam(s).

CLINICAL DATA: Screening.

EXAM:
DIGITAL SCREENING BILATERAL MAMMOGRAM WITH CAD

[R CC]
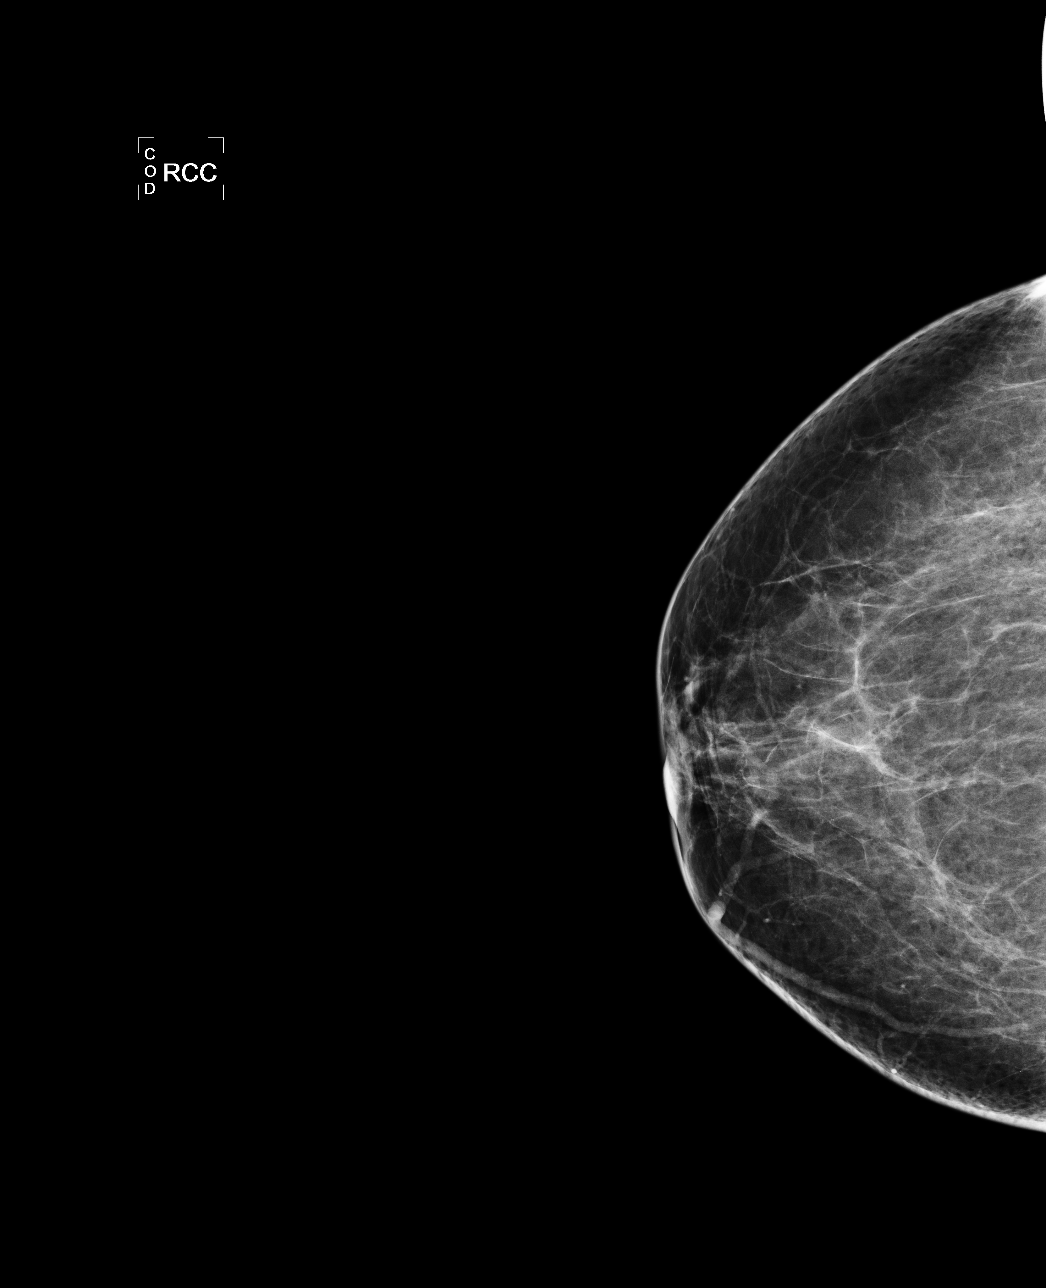

[L CC]
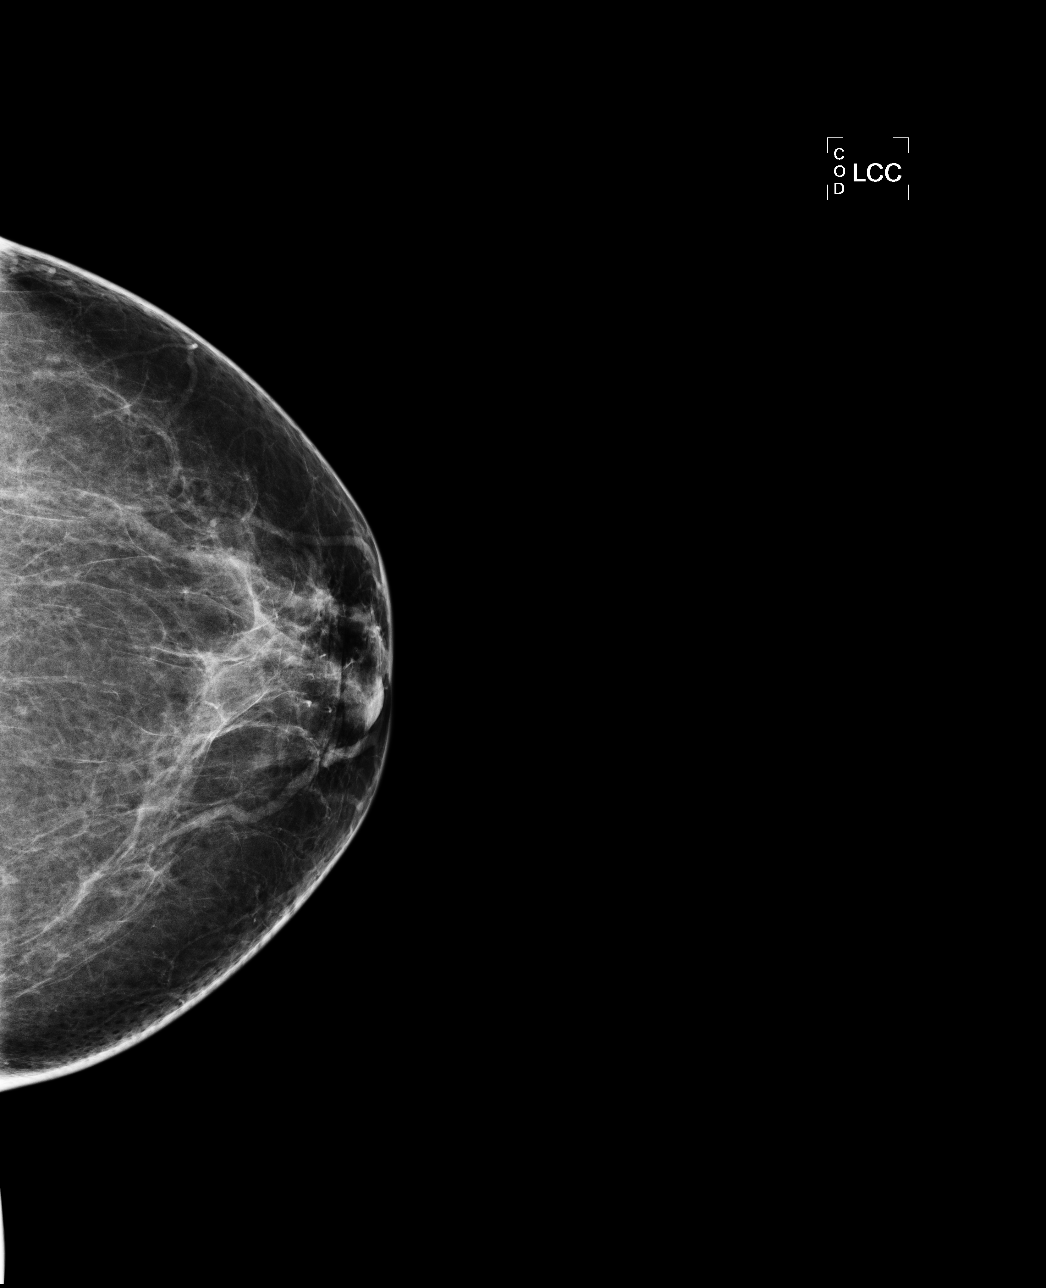

[L MLO]
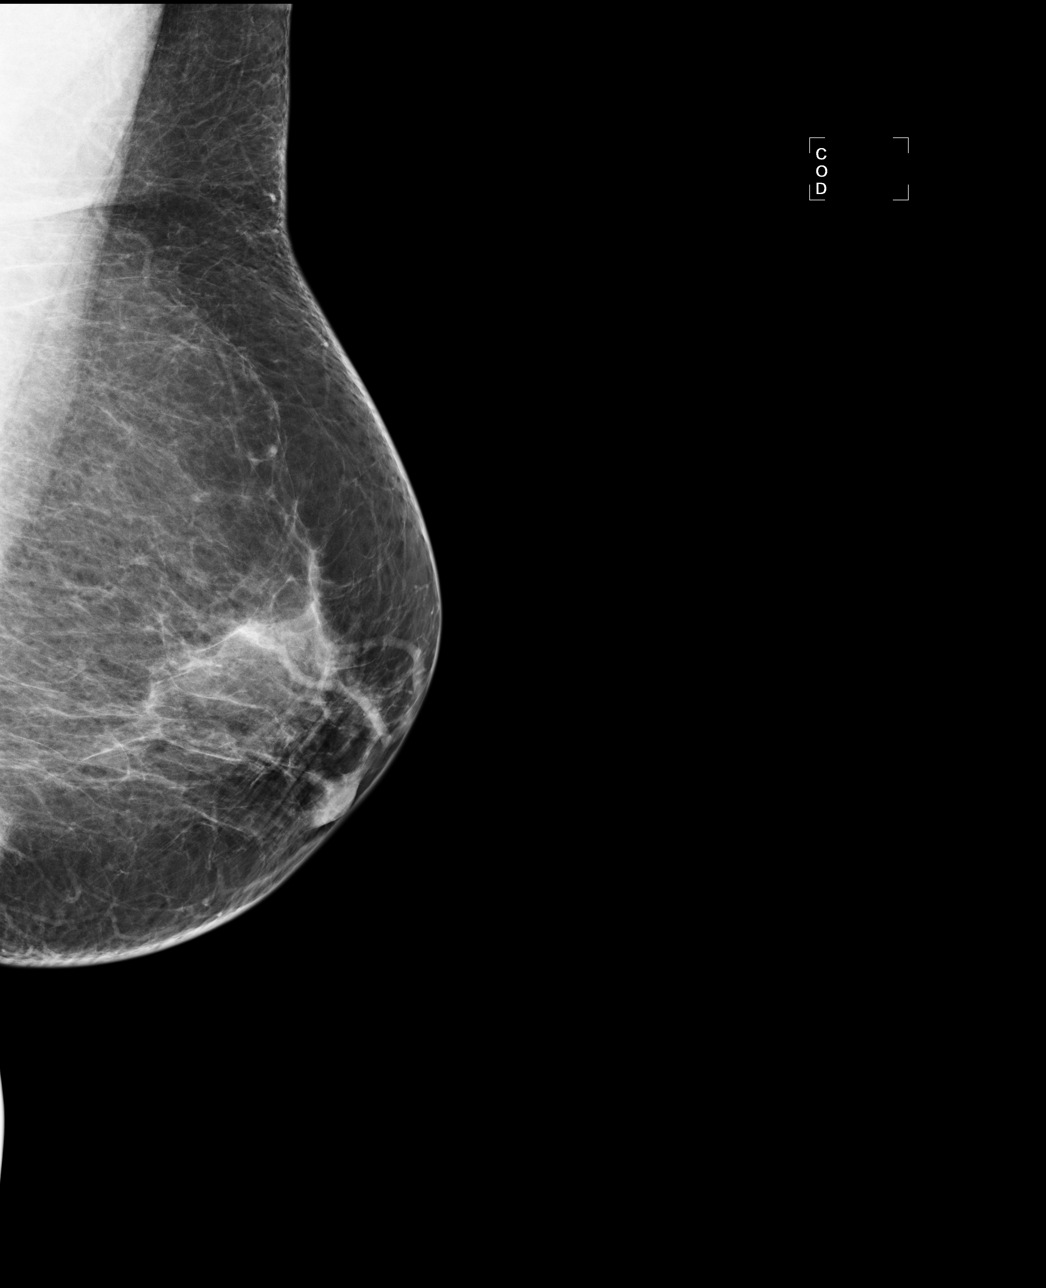

[R MLO]
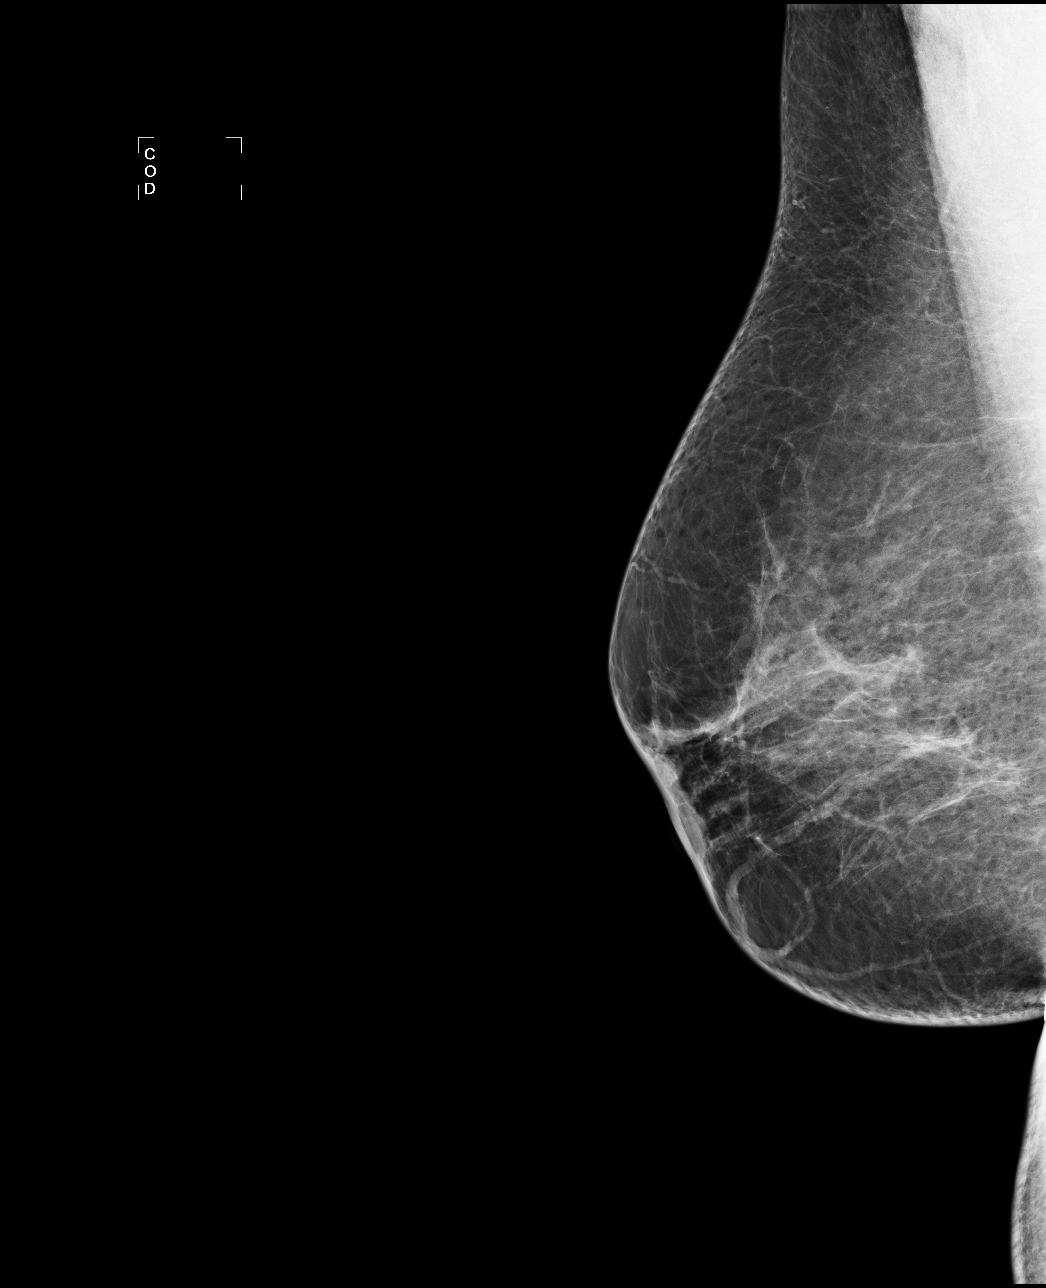

[4 of 4 positions shown; findings below may reference images not displayed]

ACR Breast Density Category b: There are scattered areas of
fibroglandular density.
FINDINGS: There are no findings suspicious for malignancy. Images were
processed with CAD.
IMPRESSION: No mammographic evidence of malignancy. A result letter of this
screening mammogram will be mailed directly to the patient.

RECOMMENDATION:
Screening mammogram in one year. (Code:AS-G-LCT)

BI-RADS CATEGORY  1: Negative.

## 2019-07-04 ENCOUNTER — Ambulatory Visit: Payer: BLUE CROSS/BLUE SHIELD | Attending: Internal Medicine

## 2019-07-04 DIAGNOSIS — Z23 Encounter for immunization: Secondary | ICD-10-CM

## 2019-07-04 NOTE — Progress Notes (Signed)
   Covid-19 Vaccination Clinic  Name:  Jeanette Kelley    MRN: [id number] DOB: [birth year]  07/04/2019  [name] was observed post Covid-19 immunization for 15 minutes without incident. She was provided with Vaccine Information Sheet and instruction to access the V-Safe system.   [name] was instructed to call 911 with any severe reactions post vaccine: Marland Kitchen Difficulty breathing  . Swelling of face and throat  . A fast heartbeat  . A bad rash all over body  . Dizziness and weakness   Immunizations Administered    Name Date Dose VIS Date Route   Pfizer COVID-19 Vaccine 07/04/2019  2:45 PM 0.3 mL 04/09/2019 Intramuscular   Manufacturer: [employer], [employer]   Lot: [id number]   NDC: [address]-4

## 2019-08-03 ENCOUNTER — Ambulatory Visit: Payer: BLUE CROSS/BLUE SHIELD | Attending: Internal Medicine

## 2019-08-03 DIAGNOSIS — Z23 Encounter for immunization: Secondary | ICD-10-CM

## 2019-08-03 NOTE — Progress Notes (Signed)
   Covid-19 Vaccination Clinic  Name:  Jeanette Kelley    MRN: [id number] DOB: 1965-12-24  08/03/2019  [name] was observed post Covid-19 immunization for 15 minutes without incident. She was provided with Vaccine Information Sheet and instruction to access the V-Safe system.   [name] was instructed to call 911 with any severe reactions post vaccine: Marland Kitchen Difficulty breathing  . Swelling of face and throat  . A fast heartbeat  . A bad rash all over body  . Dizziness and weakness   Immunizations Administered    Name Date Dose VIS Date Route   Pfizer COVID-19 Vaccine 08/03/2019  1:17 PM 0.3 mL 04/09/2019 Intramuscular   Manufacturer: [employer], [employer]   Lot: [id number]   NDC: [address]-2
